# Patient Record
Sex: Female | Born: 1980 | Race: White | Hispanic: No | Smoking: Current every day smoker
Health system: Southern US, Community
[De-identification: ages and names within clinical notes are randomized; demographics above are authoritative.]

## PROBLEM LIST (undated history)

## (undated) DIAGNOSIS — K529 Noninfective gastroenteritis and colitis, unspecified: Secondary | ICD-10-CM

## (undated) DIAGNOSIS — K859 Acute pancreatitis without necrosis or infection, unspecified: Secondary | ICD-10-CM

## (undated) DIAGNOSIS — B1920 Unspecified viral hepatitis C without hepatic coma: Secondary | ICD-10-CM

## (undated) HISTORY — PX: OTHER SURGICAL HISTORY: SHX169

## (undated) HISTORY — PX: CHOLECYSTECTOMY: SHX55

## (undated) HISTORY — PX: LEG SURGERY: SHX1003

## (undated) HISTORY — PX: APPENDECTOMY: SHX54

---

## 2005-04-12 ENCOUNTER — Emergency Department (HOSPITAL_COMMUNITY): Admission: EM | Admit: 2005-04-12 | Discharge: 2005-04-12 | Payer: Self-pay | Admitting: Emergency Medicine

## 2005-07-05 ENCOUNTER — Emergency Department (HOSPITAL_COMMUNITY): Admission: EM | Admit: 2005-07-05 | Discharge: 2005-07-05 | Payer: Self-pay | Admitting: Emergency Medicine

## 2005-07-06 ENCOUNTER — Emergency Department (HOSPITAL_COMMUNITY): Admission: EM | Admit: 2005-07-06 | Discharge: 2005-07-06 | Payer: Self-pay | Admitting: Family Medicine

## 2005-09-04 ENCOUNTER — Ambulatory Visit: Payer: Self-pay | Admitting: Gastroenterology

## 2005-09-15 ENCOUNTER — Ambulatory Visit: Payer: Self-pay | Admitting: Gastroenterology

## 2005-10-06 ENCOUNTER — Encounter (INDEPENDENT_AMBULATORY_CARE_PROVIDER_SITE_OTHER): Payer: Self-pay | Admitting: Specialist

## 2005-10-06 ENCOUNTER — Ambulatory Visit (HOSPITAL_COMMUNITY): Admission: RE | Admit: 2005-10-06 | Discharge: 2005-10-06 | Payer: Self-pay | Admitting: Gastroenterology

## 2005-10-12 ENCOUNTER — Inpatient Hospital Stay (HOSPITAL_COMMUNITY): Admission: AD | Admit: 2005-10-12 | Discharge: 2005-10-12 | Payer: Self-pay | Admitting: Gynecology

## 2006-03-25 ENCOUNTER — Encounter: Admission: RE | Admit: 2006-03-25 | Discharge: 2006-03-25 | Payer: Self-pay | Admitting: Occupational Medicine

## 2006-05-11 ENCOUNTER — Inpatient Hospital Stay (HOSPITAL_COMMUNITY): Admission: AD | Admit: 2006-05-11 | Discharge: 2006-05-11 | Payer: Self-pay | Admitting: Obstetrics and Gynecology

## 2006-06-04 ENCOUNTER — Inpatient Hospital Stay (HOSPITAL_COMMUNITY): Admission: AD | Admit: 2006-06-04 | Discharge: 2006-06-06 | Payer: Self-pay | Admitting: Obstetrics and Gynecology

## 2006-09-14 ENCOUNTER — Encounter: Admission: RE | Admit: 2006-09-14 | Discharge: 2006-09-14 | Payer: Self-pay | Admitting: Neurology

## 2006-09-28 ENCOUNTER — Encounter: Admission: RE | Admit: 2006-09-28 | Discharge: 2006-09-28 | Payer: Self-pay | Admitting: Neurology

## 2006-10-04 ENCOUNTER — Ambulatory Visit (HOSPITAL_COMMUNITY): Admission: RE | Admit: 2006-10-04 | Discharge: 2006-10-04 | Payer: Self-pay | Admitting: Neurology

## 2006-10-25 ENCOUNTER — Ambulatory Visit: Payer: Self-pay | Admitting: Family Medicine

## 2006-11-30 ENCOUNTER — Ambulatory Visit: Payer: Self-pay | Admitting: Gastroenterology

## 2006-12-11 ENCOUNTER — Emergency Department (HOSPITAL_COMMUNITY): Admission: EM | Admit: 2006-12-11 | Discharge: 2006-12-11 | Payer: Self-pay | Admitting: Emergency Medicine

## 2007-01-04 ENCOUNTER — Ambulatory Visit: Payer: Self-pay | Admitting: Gastroenterology

## 2007-01-27 ENCOUNTER — Emergency Department (HOSPITAL_COMMUNITY): Admission: EM | Admit: 2007-01-27 | Discharge: 2007-01-27 | Payer: Self-pay | Admitting: Emergency Medicine

## 2007-02-08 ENCOUNTER — Ambulatory Visit: Payer: Self-pay | Admitting: Gastroenterology

## 2007-03-18 ENCOUNTER — Ambulatory Visit: Payer: Self-pay | Admitting: Physical Medicine & Rehabilitation

## 2007-03-18 ENCOUNTER — Encounter
Admission: RE | Admit: 2007-03-18 | Discharge: 2007-06-16 | Payer: Self-pay | Admitting: Physical Medicine & Rehabilitation

## 2007-03-24 ENCOUNTER — Encounter
Admission: RE | Admit: 2007-03-24 | Discharge: 2007-03-24 | Payer: Self-pay | Admitting: Physical Medicine & Rehabilitation

## 2007-04-13 ENCOUNTER — Emergency Department (HOSPITAL_COMMUNITY): Admission: EM | Admit: 2007-04-13 | Discharge: 2007-04-13 | Payer: Self-pay | Admitting: Emergency Medicine

## 2007-04-15 LAB — CONVERTED CEMR LAB: Pap Smear: NORMAL

## 2007-04-21 ENCOUNTER — Ambulatory Visit: Payer: Self-pay | Admitting: Gastroenterology

## 2007-04-26 ENCOUNTER — Emergency Department (HOSPITAL_COMMUNITY): Admission: EM | Admit: 2007-04-26 | Discharge: 2007-04-26 | Payer: Self-pay | Admitting: Emergency Medicine

## 2007-04-28 ENCOUNTER — Ambulatory Visit: Payer: Self-pay | Admitting: Physical Medicine & Rehabilitation

## 2007-05-16 ENCOUNTER — Inpatient Hospital Stay (HOSPITAL_COMMUNITY): Admission: AD | Admit: 2007-05-16 | Discharge: 2007-05-26 | Payer: Self-pay | Admitting: Gastroenterology

## 2007-05-24 ENCOUNTER — Encounter (INDEPENDENT_AMBULATORY_CARE_PROVIDER_SITE_OTHER): Payer: Self-pay | Admitting: General Surgery

## 2007-05-27 ENCOUNTER — Emergency Department (HOSPITAL_COMMUNITY): Admission: EM | Admit: 2007-05-27 | Discharge: 2007-05-27 | Payer: Self-pay | Admitting: Emergency Medicine

## 2007-06-07 ENCOUNTER — Observation Stay (HOSPITAL_COMMUNITY): Admission: EM | Admit: 2007-06-07 | Discharge: 2007-06-09 | Payer: Self-pay | Admitting: Gastroenterology

## 2007-06-13 ENCOUNTER — Ambulatory Visit: Payer: Self-pay | Admitting: Physical Medicine & Rehabilitation

## 2007-06-13 ENCOUNTER — Encounter
Admission: RE | Admit: 2007-06-13 | Discharge: 2007-09-11 | Payer: Self-pay | Admitting: Physical Medicine & Rehabilitation

## 2007-07-05 ENCOUNTER — Ambulatory Visit: Payer: Self-pay | Admitting: Physical Medicine & Rehabilitation

## 2007-08-04 ENCOUNTER — Encounter
Admission: RE | Admit: 2007-08-04 | Discharge: 2007-11-02 | Payer: Self-pay | Admitting: Physical Medicine & Rehabilitation

## 2007-08-29 ENCOUNTER — Ambulatory Visit (HOSPITAL_COMMUNITY): Admission: RE | Admit: 2007-08-29 | Discharge: 2007-08-29 | Payer: Self-pay | Admitting: Gastroenterology

## 2007-09-09 ENCOUNTER — Encounter: Admission: RE | Admit: 2007-09-09 | Discharge: 2007-10-17 | Payer: Self-pay | Admitting: Psychology

## 2007-09-30 ENCOUNTER — Ambulatory Visit: Payer: Self-pay | Admitting: Physical Medicine & Rehabilitation

## 2007-10-19 ENCOUNTER — Emergency Department (HOSPITAL_COMMUNITY): Admission: EM | Admit: 2007-10-19 | Discharge: 2007-10-19 | Payer: Self-pay | Admitting: Emergency Medicine

## 2007-11-16 ENCOUNTER — Inpatient Hospital Stay (HOSPITAL_COMMUNITY): Admission: RE | Admit: 2007-11-16 | Discharge: 2007-11-21 | Payer: Self-pay | Admitting: Psychiatry

## 2007-11-16 ENCOUNTER — Ambulatory Visit: Payer: Self-pay | Admitting: Psychiatry

## 2007-11-22 ENCOUNTER — Other Ambulatory Visit (HOSPITAL_COMMUNITY): Admission: RE | Admit: 2007-11-22 | Discharge: 2007-11-28 | Payer: Self-pay | Admitting: Psychiatry

## 2007-11-22 ENCOUNTER — Emergency Department (HOSPITAL_COMMUNITY): Admission: EM | Admit: 2007-11-22 | Discharge: 2007-11-22 | Payer: Self-pay | Admitting: Emergency Medicine

## 2007-11-23 ENCOUNTER — Ambulatory Visit: Payer: Self-pay | Admitting: Psychiatry

## 2007-11-28 ENCOUNTER — Encounter
Admission: RE | Admit: 2007-11-28 | Discharge: 2007-11-30 | Payer: Self-pay | Admitting: Physical Medicine & Rehabilitation

## 2007-11-29 ENCOUNTER — Ambulatory Visit: Payer: Self-pay | Admitting: Physical Medicine & Rehabilitation

## 2008-02-20 ENCOUNTER — Encounter
Admission: RE | Admit: 2008-02-20 | Discharge: 2008-05-20 | Payer: Self-pay | Admitting: Physical Medicine & Rehabilitation

## 2008-03-05 ENCOUNTER — Ambulatory Visit: Payer: Self-pay | Admitting: Physical Medicine & Rehabilitation

## 2008-03-20 ENCOUNTER — Emergency Department (HOSPITAL_BASED_OUTPATIENT_CLINIC_OR_DEPARTMENT_OTHER): Admission: EM | Admit: 2008-03-20 | Discharge: 2008-03-20 | Payer: Self-pay | Admitting: Emergency Medicine

## 2008-03-21 ENCOUNTER — Ambulatory Visit: Payer: Self-pay | Admitting: *Deleted

## 2008-03-21 DIAGNOSIS — G894 Chronic pain syndrome: Secondary | ICD-10-CM | POA: Insufficient documentation

## 2008-03-21 DIAGNOSIS — F319 Bipolar disorder, unspecified: Secondary | ICD-10-CM

## 2008-03-21 DIAGNOSIS — G43909 Migraine, unspecified, not intractable, without status migrainosus: Secondary | ICD-10-CM

## 2008-03-21 DIAGNOSIS — F411 Generalized anxiety disorder: Secondary | ICD-10-CM | POA: Insufficient documentation

## 2008-03-21 DIAGNOSIS — R112 Nausea with vomiting, unspecified: Secondary | ICD-10-CM

## 2008-03-21 DIAGNOSIS — K5289 Other specified noninfective gastroenteritis and colitis: Secondary | ICD-10-CM

## 2008-03-21 DIAGNOSIS — F450 Somatization disorder: Secondary | ICD-10-CM

## 2008-03-21 DIAGNOSIS — B1710 Acute hepatitis C without hepatic coma: Secondary | ICD-10-CM

## 2008-03-21 DIAGNOSIS — N912 Amenorrhea, unspecified: Secondary | ICD-10-CM

## 2008-03-21 DIAGNOSIS — F329 Major depressive disorder, single episode, unspecified: Secondary | ICD-10-CM

## 2008-03-21 DIAGNOSIS — N3000 Acute cystitis without hematuria: Secondary | ICD-10-CM | POA: Insufficient documentation

## 2008-03-21 DIAGNOSIS — R109 Unspecified abdominal pain: Secondary | ICD-10-CM | POA: Insufficient documentation

## 2008-03-22 DIAGNOSIS — J45909 Unspecified asthma, uncomplicated: Secondary | ICD-10-CM | POA: Insufficient documentation

## 2008-03-22 LAB — CONVERTED CEMR LAB: hCG, Beta Chain, Quant, S: 0.84 milliintl units/mL

## 2008-03-23 ENCOUNTER — Ambulatory Visit: Payer: Self-pay | Admitting: Internal Medicine

## 2008-03-23 ENCOUNTER — Inpatient Hospital Stay (HOSPITAL_COMMUNITY): Admission: EM | Admit: 2008-03-23 | Discharge: 2008-04-03 | Payer: Self-pay | Admitting: Emergency Medicine

## 2008-03-30 ENCOUNTER — Encounter (INDEPENDENT_AMBULATORY_CARE_PROVIDER_SITE_OTHER): Payer: Self-pay | Admitting: *Deleted

## 2008-04-06 ENCOUNTER — Telehealth (INDEPENDENT_AMBULATORY_CARE_PROVIDER_SITE_OTHER): Payer: Self-pay | Admitting: *Deleted

## 2008-05-07 ENCOUNTER — Emergency Department (HOSPITAL_BASED_OUTPATIENT_CLINIC_OR_DEPARTMENT_OTHER): Admission: EM | Admit: 2008-05-07 | Discharge: 2008-05-07 | Payer: Self-pay | Admitting: Emergency Medicine

## 2008-05-21 ENCOUNTER — Ambulatory Visit: Payer: Self-pay | Admitting: Radiology

## 2008-05-21 ENCOUNTER — Emergency Department (HOSPITAL_BASED_OUTPATIENT_CLINIC_OR_DEPARTMENT_OTHER): Admission: EM | Admit: 2008-05-21 | Discharge: 2008-05-21 | Payer: Self-pay | Admitting: Emergency Medicine

## 2008-05-26 ENCOUNTER — Emergency Department (HOSPITAL_BASED_OUTPATIENT_CLINIC_OR_DEPARTMENT_OTHER): Admission: EM | Admit: 2008-05-26 | Discharge: 2008-05-26 | Payer: Self-pay | Admitting: Emergency Medicine

## 2008-06-13 ENCOUNTER — Emergency Department (HOSPITAL_COMMUNITY): Admission: EM | Admit: 2008-06-13 | Discharge: 2008-06-13 | Payer: Self-pay | Admitting: Emergency Medicine

## 2008-06-18 ENCOUNTER — Emergency Department (HOSPITAL_COMMUNITY): Admission: EM | Admit: 2008-06-18 | Discharge: 2008-06-18 | Payer: Self-pay | Admitting: Emergency Medicine

## 2008-06-29 ENCOUNTER — Emergency Department (HOSPITAL_BASED_OUTPATIENT_CLINIC_OR_DEPARTMENT_OTHER): Admission: EM | Admit: 2008-06-29 | Discharge: 2008-06-29 | Payer: Self-pay | Admitting: Emergency Medicine

## 2008-06-29 ENCOUNTER — Ambulatory Visit: Payer: Self-pay | Admitting: Diagnostic Radiology

## 2008-07-08 ENCOUNTER — Emergency Department (HOSPITAL_COMMUNITY): Admission: EM | Admit: 2008-07-08 | Discharge: 2008-07-08 | Payer: Self-pay | Admitting: Emergency Medicine

## 2008-07-23 ENCOUNTER — Emergency Department (HOSPITAL_BASED_OUTPATIENT_CLINIC_OR_DEPARTMENT_OTHER): Admission: EM | Admit: 2008-07-23 | Discharge: 2008-07-24 | Payer: Self-pay | Admitting: Emergency Medicine

## 2008-07-23 ENCOUNTER — Ambulatory Visit: Payer: Self-pay | Admitting: Diagnostic Radiology

## 2008-07-30 ENCOUNTER — Emergency Department (HOSPITAL_BASED_OUTPATIENT_CLINIC_OR_DEPARTMENT_OTHER): Admission: EM | Admit: 2008-07-30 | Discharge: 2008-07-30 | Payer: Self-pay | Admitting: Emergency Medicine

## 2009-07-18 ENCOUNTER — Ambulatory Visit: Payer: Self-pay | Admitting: Family Medicine

## 2009-07-18 DIAGNOSIS — M79609 Pain in unspecified limb: Secondary | ICD-10-CM

## 2009-07-18 DIAGNOSIS — K859 Acute pancreatitis without necrosis or infection, unspecified: Secondary | ICD-10-CM

## 2009-07-18 DIAGNOSIS — K5090 Crohn's disease, unspecified, without complications: Secondary | ICD-10-CM | POA: Insufficient documentation

## 2009-08-12 ENCOUNTER — Encounter: Payer: Self-pay | Admitting: Family Medicine

## 2009-08-12 ENCOUNTER — Telehealth (INDEPENDENT_AMBULATORY_CARE_PROVIDER_SITE_OTHER): Payer: Self-pay | Admitting: *Deleted

## 2009-08-16 ENCOUNTER — Telehealth: Payer: Self-pay | Admitting: Family Medicine

## 2009-08-26 ENCOUNTER — Ambulatory Visit: Payer: Self-pay | Admitting: Family Medicine

## 2009-08-26 DIAGNOSIS — J029 Acute pharyngitis, unspecified: Secondary | ICD-10-CM

## 2009-08-27 ENCOUNTER — Encounter: Payer: Self-pay | Admitting: Family Medicine

## 2009-08-27 LAB — CONVERTED CEMR LAB
Eosinophils Absolute: 0 10*3/uL (ref 0.0–0.7)
Eosinophils Relative: 0 % (ref 0–5)
HCT: 46 % (ref 36.0–46.0)
Lymphs Abs: 2.2 10*3/uL (ref 0.7–4.0)
MCV: 93.5 fL (ref 78.0–100.0)
Monocytes Absolute: 0.4 10*3/uL (ref 0.1–1.0)
Monocytes Relative: 4 % (ref 3–12)
RBC: 4.92 M/uL (ref 3.87–5.11)
WBC: 8.2 10*3/uL (ref 4.0–10.5)

## 2009-09-04 ENCOUNTER — Telehealth: Payer: Self-pay | Admitting: Family Medicine

## 2009-09-05 IMAGING — RF DG UGI W/ SMALL BOWEL
17 of 24 series · 17 of 24 positions shown · non-contrast
Comparison: None

Addendum Begins

A IUD in place was noted midline pelvis.
Addendum Ends
CLINICAL DATA: Rule out Crohn's disease or peptic ulcer
UPPER GI W/ SMALL BOWEL HIGH DENSITY
TECHNIQUE: Upper GI series performed with high density barium and
effervescent agent. Thin barium also used.  Subsequently, serial
images of the small bowel were obtained including spot views of the
terminal ileum.

[Series 1: run · 1 of 1 slices shown (1 of 16)]
[im 1/1]
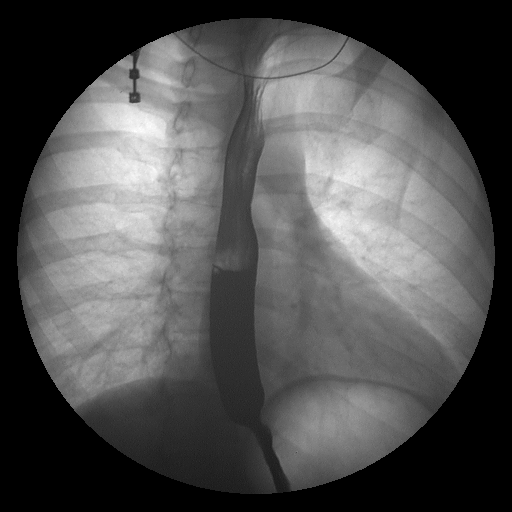

[Series 3: run · 1 of 1 slices shown (2 of 16)]
[im 1/1]
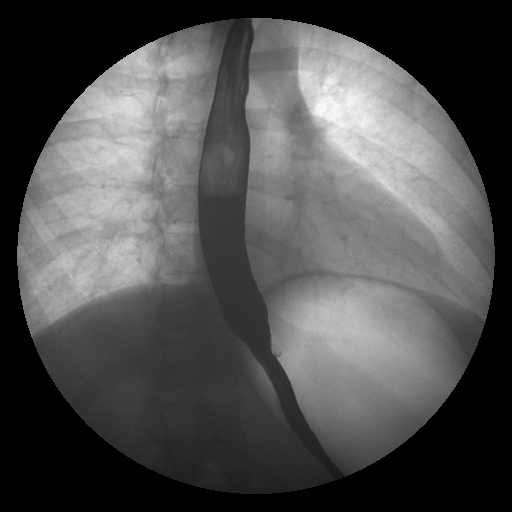

[Series 4: run · 1 of 1 slices shown (3 of 16)]
[im 1/1]
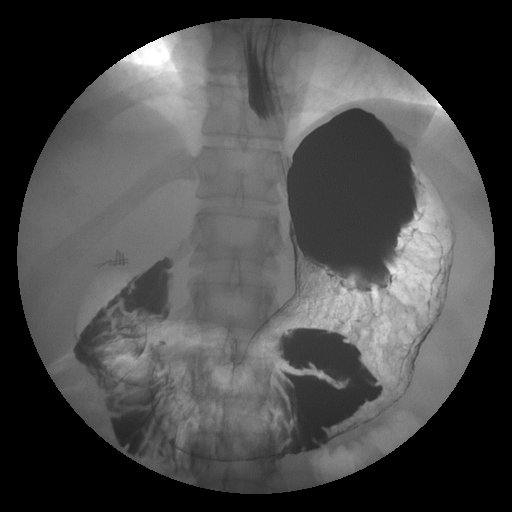

[Series 5: run · 1 of 1 slices shown (4 of 16)]
[im 1/1]
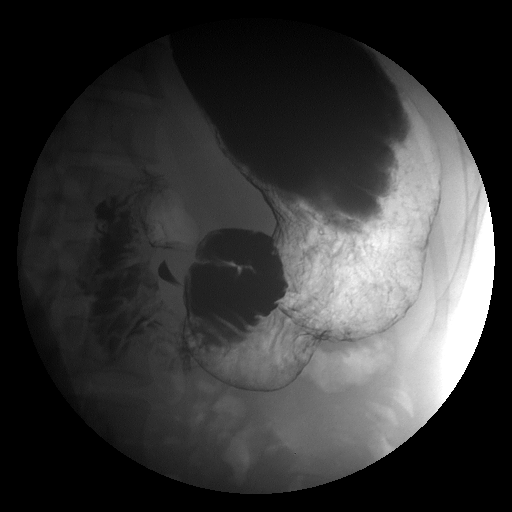

[Series 7: run · 1 of 1 slices shown (5 of 16)]
[im 1/1]
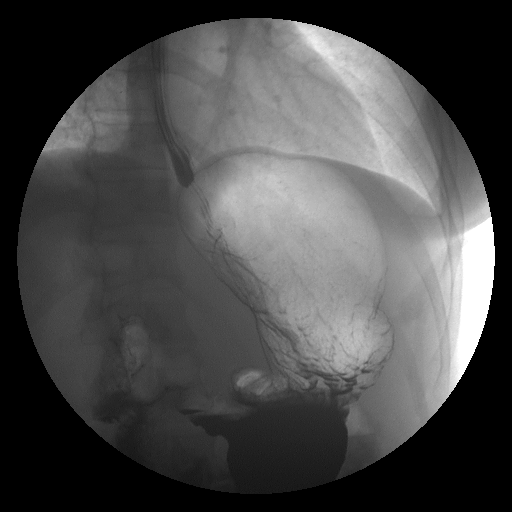

[Series 8: run · 1 of 1 slices shown (6 of 16)]
[im 1/1]
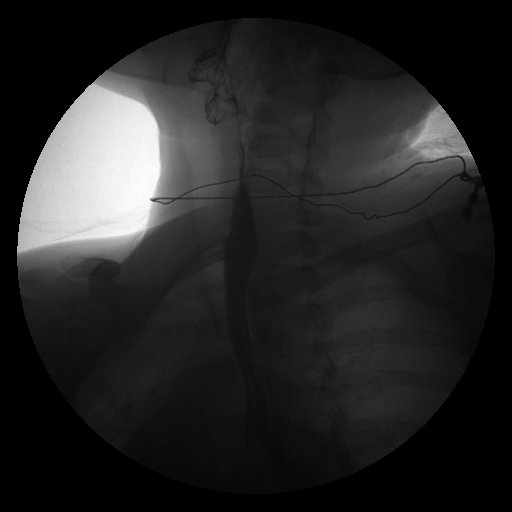

[Series 10: run · 1 of 1 slices shown (7 of 16)]
[im 1/1]
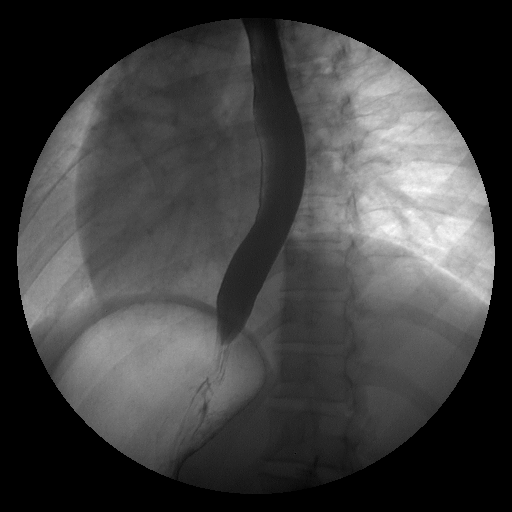

[Series 11: run · 1 of 1 slices shown (8 of 16)]
[im 1/1]
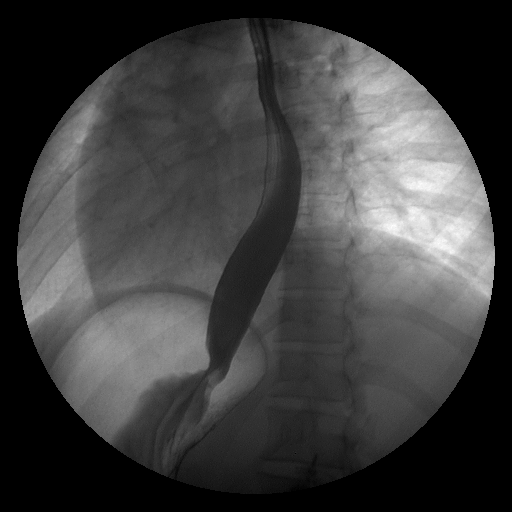

[Series 13: run · 1 of 1 slices shown (9 of 16)]
[im 1/1]
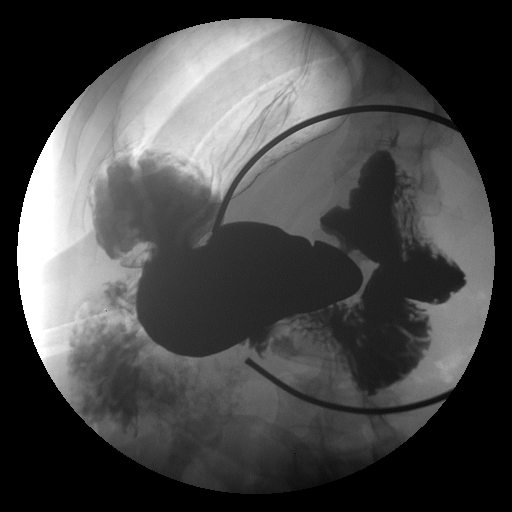

[Series 14: run · 1 of 1 slices shown (10 of 16)]
[im 1/1]
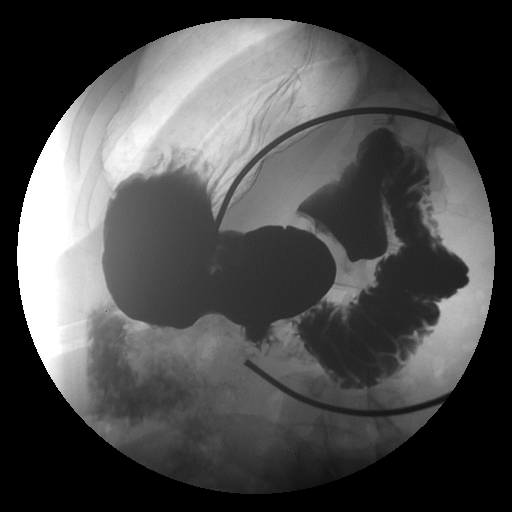

[Series 15: run · 1 of 1 slices shown (11 of 16)]
[im 1/1]
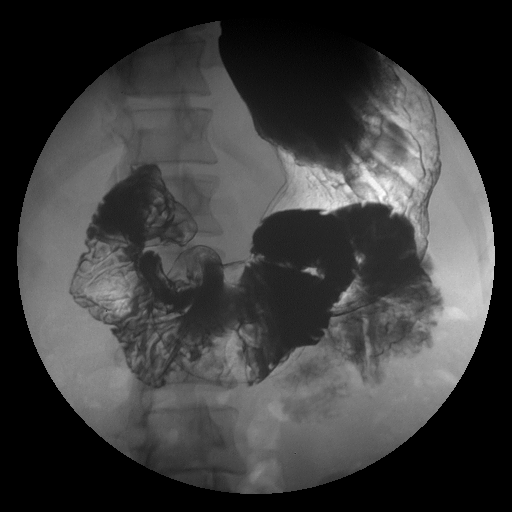

[Series 17: run · 1 of 1 slices shown (12 of 16)]
[im 1/1]
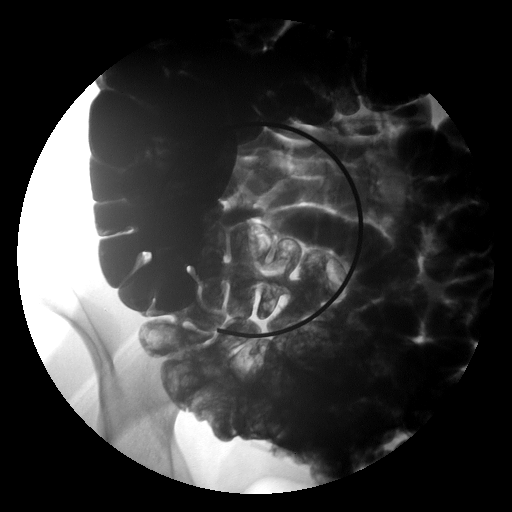

[Series 18: run · 1 of 1 slices shown (13 of 16)]
[im 1/1]
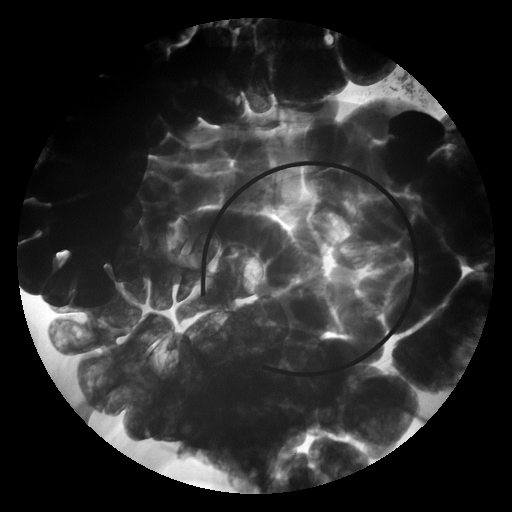

[Series 20: run · 1 of 1 slices shown (14 of 16)]
[im 1/1]
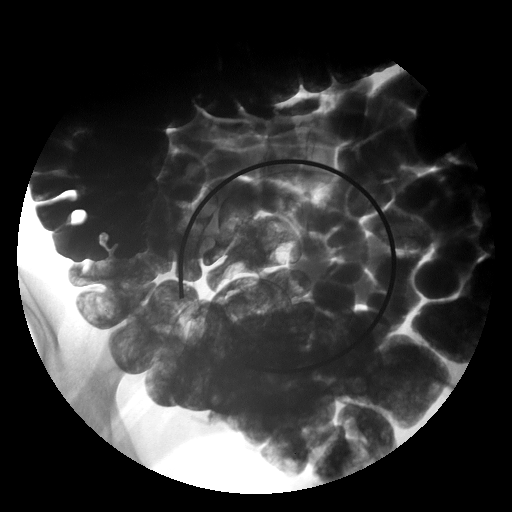

[Series 21: run · 1 of 1 slices shown (15 of 16)]
[im 1/1]
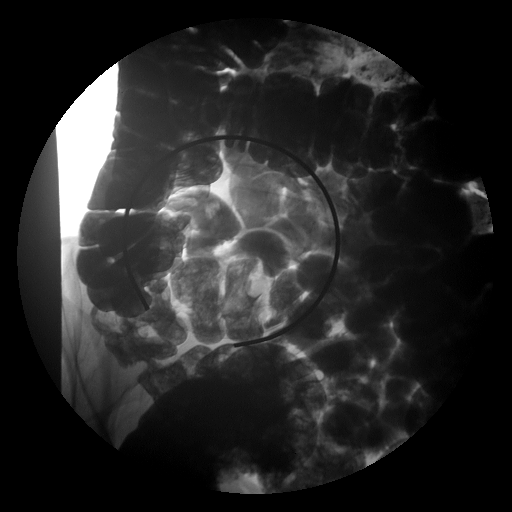

[Series 22: run · 1 of 1 slices shown (16 of 16)]
[im 1/1]
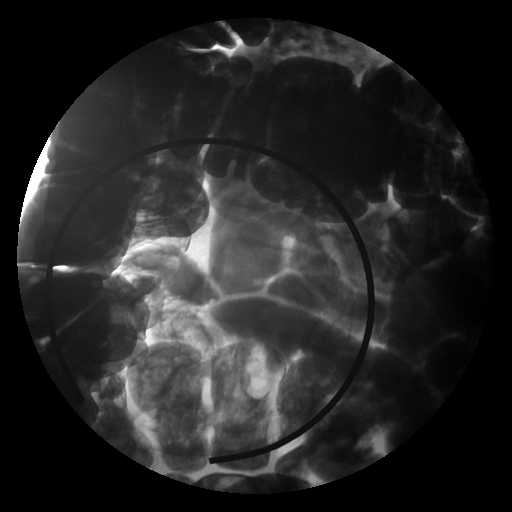

[Series 1002: view not recorded · 0.20mm/px · 1 of 1 slices shown]
[im 1/1]
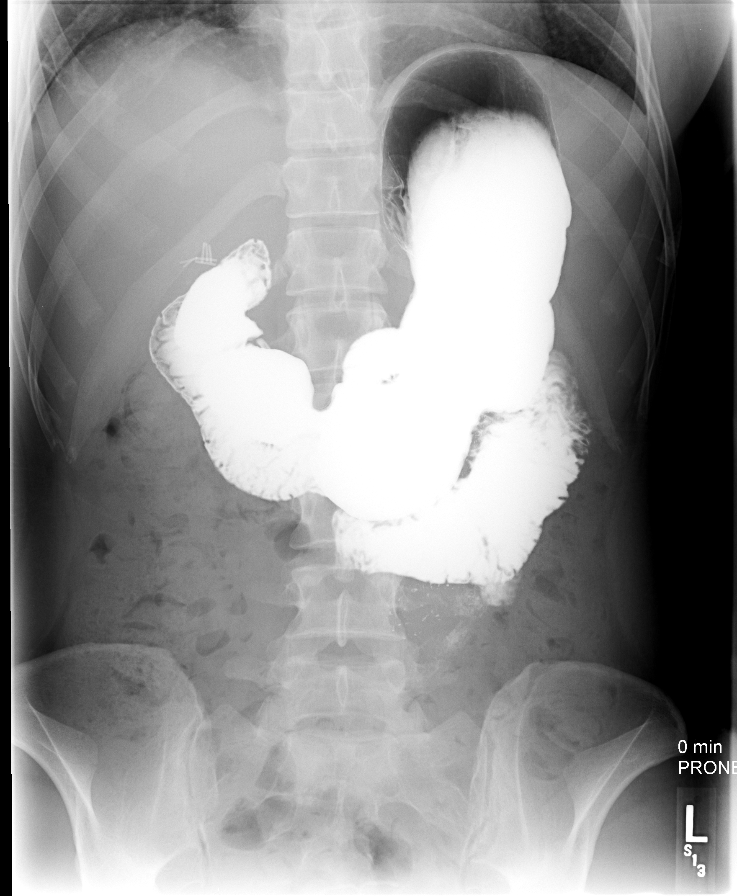

[17 of 24 positions shown; findings below may reference images not displayed]

FINDINGS: Double contrast upper GI shows the esophagus with normal
contour, distensibility and peristalsis.  No obstructing or
constricting mass is noted.  No gastroesophageal reflux was noted.
Post cholecystectomy surgical clips are noted.

The stomach shows normal contour, distensibility and peristalsis.
No obstructing or constricting mass.  Duodenal bulb and duodenal
sweep are unremarkable.  No ulcer is noted.  Spot view and
overheads were taken to evaluate the small bowel.  The transit time
is within normal limits.  The terminal ileum is unremarkable.
Stool noted throughout the colon.  No obstructing or constricting
mass is noted.  Fluoroscopy time was 5 minutes.
IMPRESSION: Unremarkable small bowel follow-through.  Unremarkable upper GI.
No ulcer.  No small bowel obstruction.  No gastroesophageal reflux.

## 2009-09-12 ENCOUNTER — Encounter: Payer: Self-pay | Admitting: Family Medicine

## 2009-09-13 ENCOUNTER — Telehealth (INDEPENDENT_AMBULATORY_CARE_PROVIDER_SITE_OTHER): Payer: Self-pay | Admitting: *Deleted

## 2009-09-13 ENCOUNTER — Telehealth: Payer: Self-pay | Admitting: Family Medicine

## 2009-09-25 ENCOUNTER — Ambulatory Visit: Payer: Self-pay | Admitting: Family Medicine

## 2009-09-30 ENCOUNTER — Telehealth (INDEPENDENT_AMBULATORY_CARE_PROVIDER_SITE_OTHER): Payer: Self-pay | Admitting: *Deleted

## 2009-10-02 ENCOUNTER — Ambulatory Visit: Payer: Self-pay | Admitting: Family Medicine

## 2009-10-02 DIAGNOSIS — R109 Unspecified abdominal pain: Secondary | ICD-10-CM

## 2009-10-02 LAB — CONVERTED CEMR LAB
Bilirubin Urine: NEGATIVE
Casts: NONE SEEN /lpf
Crystals: NONE SEEN
Ketones, ur: NEGATIVE mg/dL
Nitrite: NEGATIVE
Specific Gravity, Urine: 1.03 (ref 1.005–1.030)
Urine Glucose: NEGATIVE mg/dL
pH: 6 (ref 5.0–8.0)

## 2009-10-03 ENCOUNTER — Encounter: Admission: RE | Admit: 2009-10-03 | Discharge: 2009-10-03 | Payer: Self-pay | Admitting: Family Medicine

## 2009-10-03 ENCOUNTER — Telehealth (INDEPENDENT_AMBULATORY_CARE_PROVIDER_SITE_OTHER): Payer: Self-pay | Admitting: *Deleted

## 2009-10-04 LAB — CONVERTED CEMR LAB
Benzodiazepines.: POSITIVE — AB
Marijuana Metabolite: NEGATIVE
Methadone: NEGATIVE
Propoxyphene: NEGATIVE

## 2009-10-08 ENCOUNTER — Telehealth: Payer: Self-pay | Admitting: Family Medicine

## 2009-10-09 ENCOUNTER — Telehealth: Payer: Self-pay | Admitting: Family Medicine

## 2009-10-11 ENCOUNTER — Telehealth: Payer: Self-pay | Admitting: Family Medicine

## 2009-11-22 ENCOUNTER — Telehealth (INDEPENDENT_AMBULATORY_CARE_PROVIDER_SITE_OTHER): Payer: Self-pay | Admitting: *Deleted

## 2010-04-15 ENCOUNTER — Emergency Department (HOSPITAL_COMMUNITY): Admission: EM | Admit: 2010-04-15 | Discharge: 2010-04-15 | Payer: Self-pay | Admitting: Emergency Medicine

## 2010-05-09 ENCOUNTER — Emergency Department (HOSPITAL_COMMUNITY)
Admission: EM | Admit: 2010-05-09 | Discharge: 2010-05-09 | Payer: Self-pay | Source: Home / Self Care | Admitting: Emergency Medicine

## 2010-05-15 ENCOUNTER — Emergency Department (HOSPITAL_COMMUNITY)
Admission: EM | Admit: 2010-05-15 | Discharge: 2010-05-16 | Payer: Self-pay | Source: Home / Self Care | Admitting: Emergency Medicine

## 2010-05-22 ENCOUNTER — Emergency Department (HOSPITAL_COMMUNITY)
Admission: EM | Admit: 2010-05-22 | Discharge: 2010-05-22 | Payer: Self-pay | Source: Home / Self Care | Admitting: Emergency Medicine

## 2010-05-22 LAB — DIFFERENTIAL
Basophils Absolute: 0 10*3/uL (ref 0.0–0.1)
Basophils Relative: 0 % (ref 0–1)
Eosinophils Absolute: 0.2 10*3/uL (ref 0.0–0.7)
Eosinophils Relative: 2 % (ref 0–5)
Lymphocytes Relative: 27 % (ref 12–46)
Lymphs Abs: 2.3 10*3/uL (ref 0.7–4.0)
Monocytes Absolute: 0.6 10*3/uL (ref 0.1–1.0)
Monocytes Relative: 8 % (ref 3–12)
Neutro Abs: 5.4 10*3/uL (ref 1.7–7.7)
Neutrophils Relative %: 63 % (ref 43–77)

## 2010-05-22 LAB — COMPREHENSIVE METABOLIC PANEL
ALT: 40 U/L — ABNORMAL HIGH (ref 0–35)
AST: 31 U/L (ref 0–37)
Albumin: 3.9 g/dL (ref 3.5–5.2)
Alkaline Phosphatase: 79 U/L (ref 39–117)
BUN: 8 mg/dL (ref 6–23)
CO2: 24 mEq/L (ref 19–32)
Calcium: 9 mg/dL (ref 8.4–10.5)
Chloride: 111 mEq/L (ref 96–112)
Creatinine, Ser: 0.66 mg/dL (ref 0.4–1.2)
GFR calc Af Amer: >60 mL/min (ref >60)
GFR calc non Af Amer: >60 mL/min (ref >60)
Glucose, Bld: 93 mg/dL (ref 70–99)
Potassium: 3.8 mEq/L (ref 3.5–5.1)
Sodium: 142 mEq/L (ref 135–145)
Total Bilirubin: 0.4 mg/dL (ref 0.3–1.2)
Total Protein: 7 g/dL (ref 6.0–8.3)

## 2010-05-22 LAB — URINALYSIS, ROUTINE W REFLEX MICROSCOPIC
Bilirubin Urine: NEGATIVE
Hemoglobin, Urine: NEGATIVE
Ketones, ur: NEGATIVE mg/dL
Nitrite: NEGATIVE
Protein, ur: NEGATIVE mg/dL
Specific Gravity, Urine: 1.026 (ref 1.005–1.030)
Urine Glucose, Fasting: NEGATIVE mg/dL
Urobilinogen, UA: 0.2 mg/dL (ref 0.0–1.0)
pH: 6 (ref 5.0–8.0)

## 2010-05-22 LAB — CBC
HCT: 42.3 % (ref 36.0–46.0)
Hemoglobin: 14.6 g/dL (ref 12.0–15.0)
MCH: 31.4 pg (ref 26.0–34.0)
MCHC: 34.5 g/dL (ref 30.0–36.0)
MCV: 91 fL (ref 78.0–100.0)
Platelets: 133 10*3/uL — ABNORMAL LOW (ref 150–400)
RBC: 4.65 MIL/uL (ref 3.87–5.11)
RDW: 12.9 % (ref 11.5–15.5)
WBC: 8.5 10*3/uL (ref 4.0–10.5)

## 2010-05-22 LAB — PREGNANCY, URINE: Preg Test, Ur: NEGATIVE

## 2010-05-22 LAB — LIPASE, BLOOD: Lipase: 28 U/L (ref 11–59)

## 2010-06-08 ENCOUNTER — Encounter: Payer: Self-pay | Admitting: Gastroenterology

## 2010-06-08 ENCOUNTER — Encounter: Payer: Self-pay | Admitting: Neurology

## 2010-06-17 NOTE — Progress Notes (Signed)
Summary: Dismissal Letter Mailed Certified  Dismissal Letter sent by certified mail. Vara Guardian  September 13, 2009 11:45 AM

## 2010-06-17 NOTE — Progress Notes (Signed)
Summary: Med refills  Phone Note Refill Request Message from:  Patient on Oct 11, 2009 10:06 AM  Refills Requested: Medication #1:  ALPRAZOLAM 1 MG TABS 1 tab by mouth three times a day as needed anxiety  Medication #2:  OXYCODONE HCL 10 MG TABS 1 tab by mouth q 6 hrs as needed severe pain Initial call taken by: Payton Spark CMA,  Oct 11, 2009 10:07 AM    Prescriptions: OXYCODONE HCL 10 MG TABS (OXYCODONE HCL) 1 tab by mouth q 6 hrs as needed severe pain  #120 x 0   Entered and Authorized by:   Seymour Bars DO   Signed by:   Seymour Bars DO on 10/11/2009   Method used:   Print then Give to Patient   RxID:   [id number] ALPRAZOLAM 1 MG TABS (ALPRAZOLAM) 1 tab by mouth three times a day as needed anxiety  #90 x 0   Entered and Authorized by:   Seymour Bars DO   Signed by:   Seymour Bars DO on 10/11/2009   Method used:   Print then Give to Patient   RxID:   [id number]   Appended Document: Med refills Pt aware. Also reminded Pt that this will be her last refill and she must find a new PCP. Pt verbalized understanding.

## 2010-06-17 NOTE — Progress Notes (Signed)
Summary: Refills  Phone Note Refill Request Message from:  Patient on August 16, 2009 8:07 AM  Refills Requested: Medication #1:  ALPRAZOLAM 1 MG TABS 1 tab by mouth three times a day as needed anxiety  Medication #2:  OXYCODONE HCL 10 MG TABS 1 tab by mouth q 6 hrs as needed severe pain Initial call taken by: Payton Spark CMA,  August 16, 2009 8:07 AM    Prescriptions: OXYCODONE HCL 10 MG TABS (OXYCODONE HCL) 1 tab by mouth q 6 hrs as needed severe pain  #120 x 0   Entered and Authorized by:   Seymour Bars DO   Signed by:   Seymour Bars DO on 08/16/2009   Method used:   Printed then faxed to ...       [facility] [address] [phone]* (retail)       [address]       [place], [place]  [address]       Ph: [phone]       Fax: [phone]   RxID:   [id number] ALPRAZOLAM 1 MG TABS (ALPRAZOLAM) 1 tab by mouth three times a day as needed anxiety  #90 x 0   Entered and Authorized by:   Seymour Bars DO   Signed by:   Seymour Bars DO on 08/16/2009   Method used:   Printed then faxed to ...       [facility] [address] [phone]* (retail)       [address]       [place], [place]  [address]       Ph: [phone]       Fax: [phone]   RxID:   [id number]

## 2010-06-17 NOTE — Assessment & Plan Note (Signed)
Summary: [name]   Vital Signs:  Patient profile:   30 year old female Height:      63 inches Weight:      126 pounds BMI:     22.40 O2 Sat:      97 % on Room air Temp:     98.8 degrees F oral Pulse rate:   133 / minute BP sitting:   130 / 94  (left arm) Cuff size:   regular  Vitals Entered By: Payton Spark CMA (August 26, 2009 3:22 PM)  O2 Flow:  Room air CC: HA, ST, nausea, vomiting, chills, and hoarse x 5 days.    Primary Care Provider:  Seymour Bars DO  CC:  HA, ST, nausea, vomiting, chills, and and hoarse x 5 days. Marland Kitchen  History of Present Illness: 30 yo WF presents for 5 days of sore throat pain, sweating, N/V and subjective fevers.  She has acute laryngitis.  No rhinorrhea or cough.  No sick contacts.  She denies any abdominal pain.    She is taking Theraflu, Nyquil, Chlorasceptic.  She has had problems with tonsilitis over the years.  She is having a hard time getting fluids down.    Allergies: No Known Drug Allergies  Past History:  Past Medical History: Reviewed history from 07/18/2009 and no changes required. Chronic pain syndrome - was seeing - Dr. Riley Kill Somatization disorder depression anxiety bipolar hepatitis c migraines h/o alochol and drug abuse Asthma UTI's recurrent  Past Surgical History: Reviewed history from 07/18/2009 and no changes required. Appendectomy Cholecystectomy gall bladder 09 R leg surgery 2010  Social History: Reviewed history from 07/18/2009 and no changes required. Unemplolyed  one child - in the care of the child's father separated from husband dating Leonoard Hollifield. Quit smoking 1 ppd x 16 yrs.-- still smoking. Has Mirena IUD  Review of Systems      See HPI  Physical Exam  General:  alert, well-developed, well-nourished, and well-hydrated.   Head:  normocephalic and atraumatic.   Eyes:  conjunctiva clear Nose:  no rhinorrhea Mouth:  throat mildly injected.  patent airway.  o/p vesicles present.  No  exudates.  no tonsilar hypertrophy Neck:  no masses.   Lungs:  Normal respiratory effort, chest expands symmetrically. Lungs are clear to auscultation, no crackles or wheezes. Heart:  no murmur and tachycardia.   Abdomen:  soft and non-tender.   Skin:  color normal.   Cervical Nodes:  shotty anterior cervical chain LA   Impression & Recommendations:  Problem # 1:  ACUTE PHARYNGITIS (ICD-462) Rapid strep neg.  Appears to have [name].   Check CBC with diff given rise in BP and HR with N/V during acute illness. Treat with supportive care -- MMW, Ibuprofen, Zofran and plenty of clear liquids.   Orders: Rapid Strep (956[address]) T-CBC w/Diff ([id number])  Complete Medication List: 1)  Trazodone Hcl 100 Mg Tabs (Trazodone hcl) .... One at bedtime 2)  Alprazolam 1 Mg Tabs (Alprazolam) .Marland Kitchen.. 1 tab by mouth three times a day as needed anxiety 3)  Oxycodone Hcl 10 Mg Tabs (Oxycodone hcl) .Marland Kitchen.. 1 tab by mouth q 6 hrs as needed severe pain 4)  Neurontin 300 Mg Caps (Gabapentin) .... Take 1 cap by mouth three times a day 5)  Remeron 15 Mg Tabs (Mirtazapine) .... Take 1 tab by mouth at bedtime 6)  Dukes Magic Mouthwash  .Marland Kitchen.. 10 ml swish and gargle 4 x a day as needed for sore throat pain 7)  Zofran Odt  8 Mg Tbdp (Ondansetron) .Marland Kitchen.. 1 tab by mouth three times a day as needed nausea  Patient Instructions: 1)  CBC today. 2)  Will call you w/ results tomorrow. 3)  Supportive care for [name] with MMW, ibuprofen, clear fluids and rest.  This usually lasts about 7 days. 4)  Use Zofran as needed for nausea. Prescriptions: ZOFRAN ODT 8 MG TBDP (ONDANSETRON) 1 tab by mouth three times a day as needed nausea  #24 x 0   Entered and Authorized by:   Seymour Bars DO   Signed by:   Seymour Bars DO on 08/26/2009   Method used:   Electronically to        [facility] [phone]* (retail)       [address]       [place], [place]  [address]       Ph: [phone]       Fax: [phone]   RxID:    [id number] DUKES MAGIC MOUTHWASH 10 ml swish and gargle 4 x a day as needed for sore throat pain  #160 ml x 0   Entered and Authorized by:   Seymour Bars DO   Signed by:   Seymour Bars DO on 08/26/2009   Method used:   Printed then faxed to ...       [facility] [address] [phone]* (retail)       [address]       [place], [place]  [address]       Ph: [phone]       Fax: [phone]   RxID:   [id number]   Laboratory Results    Other Tests  Rapid Strep: negative

## 2010-06-17 NOTE — Letter (Signed)
Summary: Discharge Letter  [facility]  [address] [place], [place] [address]   Phone: [phone]  Fax: [phone]       09/12/2009 MRN: [id number]  [facility] [address] [place], [place]  [address]  Dear Ms. Oesterling,   I find it necessary to inform you that I will not be able to provide medical care to you, because you have been non-compliant with our controlled substance agreement.   Since your condition requires medical attention, I suggest that you place your self under the care of another physician without delay. If you desire, I will be available for emergency care for 30 days after you receive this letter.  This should give you ample time to select a physician of your choice from the many competent providers in this area. You may want to call the local medical society or [facility]'s physician referral service [phone]) for their assistance in locating a new physician. With your written authorization, I will make a copy of your medical record available to your new physician.   Sincerely,    Seymour Bars, DO

## 2010-06-17 NOTE — Progress Notes (Signed)
Summary: Requesting early refills.  Phone Note Call from Patient   Caller: Patient Summary of Call: Pt LMOM stating her pain and anxiety has been getting worse and she has had to take xanax and oxycodone more often than Rxd. Pt would like to know if you will refill her early. Please advise. Initial call taken by: Payton Spark CMA,  September 04, 2009 10:47 AM  Follow-up for Phone Call        This is the 2nd time that pt has requested early RFs of narcotics which is grounds for dismissal with a signed narcotic contract on 07-18-2009.  I will sign a letter of dismissal for this pt.   Follow-up by: Seymour Bars DO,  September 04, 2009 11:15 AM
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
## 2010-06-17 NOTE — Miscellaneous (Signed)
Summary: Controlled Substance Agreement/[place] [facility]  Controlled Substance Agreement/[place] [facility]   Imported By: Lanelle Bal 07/26/2009 11:50:02  _____________________________________________________________________  External Attachment:    Type:   Image     Comment:   External Document

## 2010-06-17 NOTE — Progress Notes (Signed)
Summary: Dismissal Letter Resent by 1st Class Mail  Letter undeliverable. Resent by first class mail. Vara Guardian  Oct 03, 2009 11:45 AM

## 2010-06-17 NOTE — Assessment & Plan Note (Signed)
Summary: chrons/ ovarian cyst/ meds   Vital Signs:  Patient profile:   30 year old female Height:      63 inches Weight:      128 pounds BMI:     22.76 O2 Sat:      100 % on Room air Pulse rate:   128 / minute BP sitting:   122 / 85  (left arm) Cuff size:   regular  Vitals Entered By: Payton Spark CMA (Oct 02, 2009 1:15 PM)  O2 Flow:  Room air CC: F/U. Increased pain and anxiety   Primary Care Provider:  Seymour Bars DO  CC:  F/U. Increased pain and anxiety.  History of Present Illness: 30 yo WF presents for a flare up of her chrons dz.  She is having diarrhea and abdominal pain.  She has had urinary symptoms and an ovarian cyst in the past.    She is on chronic pain meds and chronic benzos.  Since her last visit, she has requested RX pain meds early on 2 occasions and a discharge letter went out which she claims that she did not recieve.  She admits to not beeing able to 'afford' going to a pain clinic or psychiatrist.  She admits to taking  more than the recommended dose of her benzos and narcotics.    Current Medications (verified): 1)  Trazodone Hcl 100 Mg Tabs (Trazodone Hcl) .... One At Bedtime 2)  Alprazolam 1 Mg Tabs (Alprazolam) .Marland Kitchen.. 1 Tab By Mouth Three Times A Day As Needed Anxiety 3)  Oxycodone Hcl 10 Mg Tabs (Oxycodone Hcl) .Marland Kitchen.. 1 Tab By Mouth Q 6 Hrs As Needed Severe Pain 4)  Neurontin 300 Mg Caps (Gabapentin) .... Take 1 Cap By Mouth Three Times A Day 5)  Remeron 15 Mg Tabs (Mirtazapine) .... Take 1 Tab By Mouth At Bedtime 6)  Zofran Odt 8 Mg Tbdp (Ondansetron) .Marland Kitchen.. 1 Tab By Mouth Three Times A Day As Needed Nausea  Allergies (verified): No Known Drug Allergies  Past History:  Past Medical History: Reviewed history from 07/18/2009 and no changes required. Chronic pain syndrome - was seeing - Dr. Riley Kill Somatization disorder depression anxiety bipolar hepatitis c migraines h/o alochol and drug abuse Asthma UTI's recurrent  Past Surgical  History: Reviewed history from 07/18/2009 and no changes required. Appendectomy Cholecystectomy gall bladder 09 R leg surgery 2010  Social History: Reviewed history from 08/26/2009 and no changes required. Unemplolyed  one child - in the care of the child's father separated from husband dating Leonoard Hollifield. Quit smoking 1 ppd x 16 yrs.-- still smoking. Has Mirena IUD  Review of Systems      See HPI  Physical Exam  General:  alert, well-developed, well-nourished, and well-hydrated.   Head:  normocephalic and atraumatic.   Eyes:  sclera non icteric Mouth:  pharynx pink and moist.   Neck:  no masses.   Lungs:  Normal respiratory effort, chest expands symmetrically. Lungs are clear to auscultation, no crackles or wheezes. Heart:  no murmur and tachycardia.   Abdomen:  soft.  diffusely TTP with vol guarding.  ND.  NABS Skin:  color normal.   Cervical Nodes:  No lymphadenopathy noted Psych:  good eye contact and slightly anxious.     Impression & Recommendations:  Problem # 1:  PELVIC PAIN, RIGHT (ICD-789.09) Assessment New Given hx of ovarian cyst, current with IUD in, unlikely to be ectopic pregnancy.  UA with micro shows Trichomonas --> will treat with Flagyl 2 gram  x 1.  This may have caused some pelvic pain.  U/S done shows a small ? hemorraghic cyst on the R ovary o/w normal.  Will treat conservatively with NSAIDs and heating pad.  If not improved in 5 days, will get her in with gyn. Her updated medication list for this problem includes:    Oxycodone Hcl 10 Mg Tabs (Oxycodone hcl) .Marland Kitchen... 1 tab by mouth q 6 hrs as needed severe pain  Orders: T-*Unlisted Diagnostic X-ray test/procedure (04540) T-Urinalysis ([id number])  Problem # 2:  CHRONIC PAIN SYNDROME (ICD-338.4) Complicated hx and hx of abuse.  As stated prior, Lalanya has requested early RF x 2 and admits to taking more than the prescribed dose of Oxycodone under a narcotic contract with me.  She has been  formally discharged from the practice.  In order for me to bridge her meds, she will need to pass a UDS today. Orders: T-Drug Screen-Urine, (single) [id number]) T-Drug Screen-Urine, ea (mullti) [phone])  Problem # 3:  CROHN'S DISEASE (ICD-555.9) Having abd cramping and diarrhea.  Has seen GI but was due for f/u.  For now, I will treat her with short term use of Prednisone but she needs to f/u with GI.  Problem # 4:  ANXIETY (ICD-300.00) Pt is taking more than the recommended dose of alprazolam.  Will give her 30 days to find a new provider.  She has formally been discharged from this practice.  She has requested early RFs 2 x and admits to using more than the recommended dose. Her updated medication list for this problem includes:    Trazodone Hcl 100 Mg Tabs (Trazodone hcl) ..... One at bedtime    Alprazolam 1 Mg Tabs (Alprazolam) .Marland Kitchen... 1 tab by mouth three times a day as needed anxiety    Remeron 30 Mg Tabs (Mirtazapine) .Marland Kitchen... 1 tab by mouth daily  Complete Medication List: 1)  Trazodone Hcl 100 Mg Tabs (Trazodone hcl) .... One at bedtime 2)  Alprazolam 1 Mg Tabs (Alprazolam) .Marland Kitchen.. 1 tab by mouth three times a day as needed anxiety 3)  Oxycodone Hcl 10 Mg Tabs (Oxycodone hcl) .Marland Kitchen.. 1 tab by mouth q 6 hrs as needed severe pain 4)  Neurontin 300 Mg Caps (Gabapentin) .... Take 1 cap by mouth three times a day 5)  Remeron 30 Mg Tabs (Mirtazapine) .Marland Kitchen.. 1 tab by mouth daily 6)  Zofran Odt 8 Mg Tbdp (Ondansetron) .Marland Kitchen.. 1 tab by mouth three times a day as needed nausea 7)  Prednisone (pak) 10 Mg Tabs (Prednisone) .... Take x 10 days as directed 8)  Flagyl 500 Mg Tabs (Metronidazole) .... 4 tabs by mouth x 1 dose  Patient Instructions: 1)  Increase Remeron to 30 mg/ day. 2)  Urine testing downstairs today. 3)  Will call you w/ results tomorrow. 4)  Will set up transvaginal u/s tomorrow downstairs. Prescriptions: FLAGYL 500 MG TABS (METRONIDAZOLE) 4 tabs by mouth x 1 dose  #4 tabs x 0    Entered and Authorized by:   Seymour Bars DO   Signed by:   Seymour Bars DO on 10/02/2009   Method used:   Electronically to        [facility] [phone]* (retail)       [address]       [place], [place]  [address]       Ph: [phone]       Fax: [phone]   RxID:   [id number] PREDNISONE (PAK) 10 MG TABS (PREDNISONE) take  x 10 days as directed  #1 x 0   Entered and Authorized by:   Seymour Bars DO   Signed by:   Seymour Bars DO on 10/02/2009   Method used:   Electronically to        [facility] [phone]* (retail)       [address]       [place], [place]  [address]       Ph: [phone]       Fax: [phone]   RxID:   [id number] REMERON 30 MG TABS (MIRTAZAPINE) 1 tab by mouth daily  #30 x 3   Entered and Authorized by:   Seymour Bars DO   Signed by:   Seymour Bars DO on 10/02/2009   Method used:   Electronically to        [facility] [phone]* (retail)       [address]       [place], [place]  [address]       Ph: [phone]       Fax: [phone]   RxID:   [id number]

## 2010-06-17 NOTE — Progress Notes (Signed)
Summary: Refill Question - jr   Phone Note Call from Patient   Caller: Patient Summary of Call: Pt. called and was wondering since she got her last Xantex Rx. on the 1st of April, Can she have them refill today since the 1st of May is this Sunday when we are closed. Called pt. back at [phone] Initial call taken by: Michaelle Copas,  September 13, 2009 11:00 AM  Follow-up for Phone Call        Rx is ready for Pt to pick up. Follow-up by: Payton Spark CMA,  September 13, 2009 11:14 AM

## 2010-06-17 NOTE — Progress Notes (Signed)
Summary: Refill Request  Phone Note Refill Request   Refills Requested: Medication #1:  ALPRAZOLAM 1 MG TABS 1 tab by mouth three times a day as needed anxiety  Medication #2:  OXYCODONE HCL 10 MG TABS 1 tab by mouth q 6 hrs as needed severe pain Initial call taken by: Payton Spark CMA,  September 13, 2009 8:58 AM    Prescriptions: OXYCODONE HCL 10 MG TABS (OXYCODONE HCL) 1 tab by mouth q 6 hrs as needed severe pain  #120 x 0   Entered and Authorized by:   Seymour Bars DO   Signed by:   Seymour Bars DO on 09/13/2009   Method used:   Print then Give to Patient   RxID:   [id number] ALPRAZOLAM 1 MG TABS (ALPRAZOLAM) 1 tab by mouth three times a day as needed anxiety  #90 x 0   Entered and Authorized by:   Seymour Bars DO   Signed by:   Seymour Bars DO on 09/13/2009   Method used:   Print then Give to Patient   RxID:   [id number]

## 2010-06-17 NOTE — Letter (Signed)
Summary: Generic Letter  [facility]  [address]   [place], [place] [address]   Phone: [phone]  Fax: [phone]    08/26/2009  [facility] [address] [place], [place]  [address]  Dear Ms. [facility],  This letter if to verify that you were seen in the office today and you will be able to have contact with your son by 08/29/2009.     Sincerely,    Seymour Bars DO

## 2010-06-17 NOTE — Progress Notes (Signed)
Summary: Narcotics and refills  Phone Note Call from Patient   Caller: Patient Summary of Call: Pt requested change in xanax, oxycodone and/or remeron strength/dose. Pt states her GI pain is worse and didn't want to accept any narcotics from GI doctor today. GI doctor did fax letter stating he didn't prescribe any narcs for Pt. I explained to Pt that you will have to review full notes from GI and decide what is best. Also explained that GI may need to take over oxycodone Rx if they do not agree w/ regimen. Pt states she will also schedule OV to discuss anxiety and depression for poss change in meds.  Initial call taken by: Payton Spark CMA,  August 12, 2009 4:30 PM

## 2010-06-17 NOTE — Consult Note (Signed)
Summary: [facility]  [facility]   Imported By: Lanelle Bal 08/26/2009 10:01:09  _____________________________________________________________________  External Attachment:    Type:   Image     Comment:   External Document

## 2010-06-17 NOTE — Progress Notes (Signed)
Summary: Early refills  Phone Note Call from Patient   Caller: Patient Summary of Call: Boyfriend [facility]) called asking for [place] to help Pt out w/ her pain and anxiety meds until her OV on Weds. I advised him that no early refills will be given and if Pt is in that much pain she needs to go to the ED. Pt and BF agreed but Pt will try to wait to OV.  Initial call taken by: Payton Spark CMA,  Sep 30, 2009 11:20 AM

## 2010-06-17 NOTE — Progress Notes (Signed)
Summary: Prednisone and drug screen  Phone Note Call from Patient   Caller: Patient [phone] Summary of Call: Pt states she only took prednisone for 1 day bc it made her very irritable and angry. I told Pt that this is common but would ask if you had anyother suggestions. Pt also wanted to know what the drug screen showed. Please advise. Initial call taken by: Payton Spark CMA,  Oct 08, 2009 10:26 AM  Follow-up for Phone Call        pt tested NEG for the drug she was being prescribed so her discharge still stands.  Stop prednsione if it is causing mood changes.  Follow-up by: Seymour Bars DO,  Oct 08, 2009 11:01 AM     Appended Document: Prednisone and drug screen [facility] informing Pt of the above

## 2010-06-17 NOTE — Progress Notes (Signed)
Summary: UDS  Phone Note Call from Patient   Caller: Patient Summary of Call: Pt states she her UDS may have been neg bc she had not taken any oxycodone for several days prior bc she ran out. Pt states she showed you the empty bottle and also discussed that [facility] may have stolen some of them bc she ran out early. Please advise. Initial call taken by: Payton Spark CMA,  Oct 09, 2009 12:26 PM  Follow-up for Phone Call        Patient's discharge still stands.  It was up to pt to be responsible for her meds and taken them as prescribed.  She can get a RF on the FRI but needs to find a new PCP. Follow-up by: Seymour Bars DO,  Oct 09, 2009 12:37 PM     Appended Document: UDS Pt aware

## 2010-07-28 LAB — DIFFERENTIAL
Basophils Absolute: 0 K/uL (ref 0.0–0.1)
Basophils Relative: 0 % (ref 0–1)
Eosinophils Absolute: 0.2 K/uL (ref 0.0–0.7)
Eosinophils Relative: 3 % (ref 0–5)
Lymphocytes Relative: 51 % — ABNORMAL HIGH (ref 12–46)
Lymphs Abs: 3.4 K/uL (ref 0.7–4.0)
Monocytes Absolute: 0.5 K/uL (ref 0.1–1.0)
Monocytes Relative: 7 % (ref 3–12)
Neutro Abs: 2.7 10*3/uL (ref 1.7–7.7)
Neutrophils Relative %: 40 % — ABNORMAL LOW (ref 43–77)

## 2010-07-28 LAB — URINALYSIS, ROUTINE W REFLEX MICROSCOPIC
Bilirubin Urine: NEGATIVE
Glucose, UA: NEGATIVE mg/dL
Hgb urine dipstick: NEGATIVE
Ketones, ur: NEGATIVE mg/dL
Nitrite: NEGATIVE
Protein, ur: NEGATIVE mg/dL
Specific Gravity, Urine: 1.022 (ref 1.005–1.030)
Urobilinogen, UA: 1 mg/dL (ref 0.0–1.0)
pH: 6.5 (ref 5.0–8.0)

## 2010-07-28 LAB — COMPREHENSIVE METABOLIC PANEL WITH GFR
ALT: 45 U/L — ABNORMAL HIGH (ref 0–35)
AST: 40 U/L — ABNORMAL HIGH (ref 0–37)
Albumin: 4.2 g/dL (ref 3.5–5.2)
CO2: 29 meq/L (ref 19–32)
Chloride: 105 meq/L (ref 96–112)
Creatinine, Ser: 0.64 mg/dL (ref 0.4–1.2)
GFR calc Af Amer: >60 mL/min (ref >60)
GFR calc non Af Amer: >60 mL/min (ref >60)
Sodium: 142 meq/L (ref 135–145)
Total Bilirubin: 0.5 mg/dL (ref 0.3–1.2)

## 2010-07-28 LAB — COMPREHENSIVE METABOLIC PANEL
Alkaline Phosphatase: 74 U/L (ref 39–117)
BUN: 6 mg/dL (ref 6–23)
Calcium: 9.3 mg/dL (ref 8.4–10.5)
Glucose, Bld: 87 mg/dL (ref 70–99)
Potassium: 3.2 mEq/L — ABNORMAL LOW (ref 3.5–5.1)
Total Protein: 6.8 g/dL (ref 6.0–8.3)

## 2010-07-28 LAB — CBC
HCT: 44.3 % (ref 36.0–46.0)
Hemoglobin: 15.3 g/dL — ABNORMAL HIGH (ref 12.0–15.0)
MCH: 31.3 pg (ref 26.0–34.0)
MCHC: 34.5 g/dL (ref 30.0–36.0)
MCV: 90.6 fL (ref 78.0–100.0)
Platelets: 137 10*3/uL — ABNORMAL LOW (ref 150–400)
RBC: 4.89 MIL/uL (ref 3.87–5.11)
RDW: 12.6 % (ref 11.5–15.5)
WBC: 6.7 K/uL (ref 4.0–10.5)

## 2010-07-28 LAB — LIPASE, BLOOD: Lipase: 18 U/L (ref 11–59)

## 2010-07-29 LAB — POCT PREGNANCY, URINE: Preg Test, Ur: NEGATIVE

## 2010-07-29 LAB — URINALYSIS, ROUTINE W REFLEX MICROSCOPIC
Glucose, UA: NEGATIVE mg/dL
Hgb urine dipstick: NEGATIVE
Ketones, ur: NEGATIVE mg/dL
Protein, ur: NEGATIVE mg/dL
pH: 6.5 (ref 5.0–8.0)

## 2010-07-29 LAB — DIFFERENTIAL
Basophils Relative: 0 % (ref 0–1)
Eosinophils Absolute: 0.1 10*3/uL (ref 0.0–0.7)
Lymphs Abs: 2.3 10*3/uL (ref 0.7–4.0)
Monocytes Absolute: 0.3 10*3/uL (ref 0.1–1.0)
Monocytes Relative: 6 % (ref 3–12)

## 2010-07-29 LAB — COMPREHENSIVE METABOLIC PANEL
ALT: 40 U/L — ABNORMAL HIGH (ref 0–35)
Albumin: 4 g/dL (ref 3.5–5.2)
Alkaline Phosphatase: 55 U/L (ref 39–117)
GFR calc Af Amer: >60 mL/min (ref >60)
Potassium: 3.7 mEq/L (ref 3.5–5.1)
Sodium: 141 mEq/L (ref 135–145)
Total Protein: 6.6 g/dL (ref 6.0–8.3)

## 2010-07-29 LAB — CBC
HCT: 42.3 % (ref 36.0–46.0)
Platelets: 114 10*3/uL — ABNORMAL LOW (ref 150–400)
RDW: 12.7 % (ref 11.5–15.5)
WBC: 5.2 10*3/uL (ref 4.0–10.5)

## 2010-08-10 ENCOUNTER — Emergency Department (HOSPITAL_COMMUNITY): Payer: Self-pay

## 2010-08-10 ENCOUNTER — Emergency Department (HOSPITAL_COMMUNITY)
Admission: EM | Admit: 2010-08-10 | Discharge: 2010-08-11 | Disposition: A | Discharge status: Home / Self Care | Payer: Self-pay | Attending: Emergency Medicine | Admitting: Emergency Medicine

## 2010-08-10 DIAGNOSIS — F319 Bipolar disorder, unspecified: Secondary | ICD-10-CM | POA: Insufficient documentation

## 2010-08-10 DIAGNOSIS — R112 Nausea with vomiting, unspecified: Secondary | ICD-10-CM | POA: Insufficient documentation

## 2010-08-10 DIAGNOSIS — Z8619 Personal history of other infectious and parasitic diseases: Secondary | ICD-10-CM | POA: Insufficient documentation

## 2010-08-10 DIAGNOSIS — R197 Diarrhea, unspecified: Secondary | ICD-10-CM | POA: Insufficient documentation

## 2010-08-10 DIAGNOSIS — F411 Generalized anxiety disorder: Secondary | ICD-10-CM | POA: Insufficient documentation

## 2010-08-10 DIAGNOSIS — R10816 Epigastric abdominal tenderness: Secondary | ICD-10-CM | POA: Insufficient documentation

## 2010-08-10 DIAGNOSIS — Z79899 Other long term (current) drug therapy: Secondary | ICD-10-CM | POA: Insufficient documentation

## 2010-08-10 DIAGNOSIS — G40909 Epilepsy, unspecified, not intractable, without status epilepticus: Secondary | ICD-10-CM | POA: Insufficient documentation

## 2010-08-10 DIAGNOSIS — R141 Gas pain: Secondary | ICD-10-CM | POA: Insufficient documentation

## 2010-08-10 DIAGNOSIS — R142 Eructation: Secondary | ICD-10-CM | POA: Insufficient documentation

## 2010-08-10 DIAGNOSIS — K5090 Crohn's disease, unspecified, without complications: Secondary | ICD-10-CM | POA: Insufficient documentation

## 2010-08-10 DIAGNOSIS — R1013 Epigastric pain: Secondary | ICD-10-CM | POA: Insufficient documentation

## 2010-08-10 DIAGNOSIS — N898 Other specified noninflammatory disorders of vagina: Secondary | ICD-10-CM | POA: Insufficient documentation

## 2010-08-10 LAB — URINE MICROSCOPIC-ADD ON

## 2010-08-10 LAB — URINALYSIS, ROUTINE W REFLEX MICROSCOPIC
Protein, ur: NEGATIVE mg/dL
Specific Gravity, Urine: 1.026 (ref 1.005–1.030)
Urobilinogen, UA: 1 mg/dL (ref 0.0–1.0)

## 2010-08-11 LAB — COMPREHENSIVE METABOLIC PANEL
AST: 27 U/L (ref 0–37)
Albumin: 3.7 g/dL (ref 3.5–5.2)
BUN: 14 mg/dL (ref 6–23)
Calcium: 8.8 mg/dL (ref 8.4–10.5)
Chloride: 108 mEq/L (ref 96–112)
Creatinine, Ser: 0.72 mg/dL (ref 0.4–1.2)
GFR calc Af Amer: >60 mL/min (ref >60)
Total Bilirubin: 0.3 mg/dL (ref 0.3–1.2)
Total Protein: 7 g/dL (ref 6.0–8.3)

## 2010-08-11 LAB — DIFFERENTIAL
Basophils Relative: 0 % (ref 0–1)
Eosinophils Absolute: 0.3 10*3/uL (ref 0.0–0.7)
Eosinophils Relative: 5 % (ref 0–5)
Lymphs Abs: 2.1 10*3/uL (ref 0.7–4.0)
Monocytes Absolute: 0.5 10*3/uL (ref 0.1–1.0)
Monocytes Relative: 8 % (ref 3–12)
Neutrophils Relative %: 53 % (ref 43–77)

## 2010-08-11 LAB — CBC
MCH: 31.1 pg (ref 26.0–34.0)
MCHC: 35.1 g/dL (ref 30.0–36.0)
MCV: 88.6 fL (ref 78.0–100.0)
Platelets: 154 10*3/uL (ref 150–400)
RBC: 4.47 MIL/uL (ref 3.87–5.11)
RDW: 13.1 % (ref 11.5–15.5)

## 2010-08-12 ENCOUNTER — Emergency Department (HOSPITAL_COMMUNITY)
Admission: EM | Admit: 2010-08-12 | Discharge: 2010-08-12 | Disposition: A | Discharge status: Home / Self Care | Payer: Self-pay | Attending: Emergency Medicine | Admitting: Emergency Medicine

## 2010-08-12 DIAGNOSIS — Z79899 Other long term (current) drug therapy: Secondary | ICD-10-CM | POA: Insufficient documentation

## 2010-08-12 DIAGNOSIS — G40909 Epilepsy, unspecified, not intractable, without status epilepticus: Secondary | ICD-10-CM | POA: Insufficient documentation

## 2010-08-12 DIAGNOSIS — R109 Unspecified abdominal pain: Secondary | ICD-10-CM | POA: Insufficient documentation

## 2010-08-12 DIAGNOSIS — F411 Generalized anxiety disorder: Secondary | ICD-10-CM | POA: Insufficient documentation

## 2010-08-12 DIAGNOSIS — R112 Nausea with vomiting, unspecified: Secondary | ICD-10-CM | POA: Insufficient documentation

## 2010-08-12 DIAGNOSIS — Z8639 Personal history of other endocrine, nutritional and metabolic disease: Secondary | ICD-10-CM | POA: Insufficient documentation

## 2010-08-12 DIAGNOSIS — Z862 Personal history of diseases of the blood and blood-forming organs and certain disorders involving the immune mechanism: Secondary | ICD-10-CM | POA: Insufficient documentation

## 2010-08-12 DIAGNOSIS — F319 Bipolar disorder, unspecified: Secondary | ICD-10-CM | POA: Insufficient documentation

## 2010-08-12 DIAGNOSIS — K5090 Crohn's disease, unspecified, without complications: Secondary | ICD-10-CM | POA: Insufficient documentation

## 2010-08-12 LAB — HEPATIC FUNCTION PANEL
ALT: 49 U/L — ABNORMAL HIGH (ref 0–35)
AST: 45 U/L — ABNORMAL HIGH (ref 0–37)
Albumin: 4.2 g/dL (ref 3.5–5.2)
Alkaline Phosphatase: 53 U/L (ref 39–117)
Total Protein: 7.8 g/dL (ref 6.0–8.3)

## 2010-08-12 LAB — DIFFERENTIAL
Basophils Relative: 0 % (ref 0–1)
Eosinophils Absolute: 0.2 10*3/uL (ref 0.0–0.7)
Lymphs Abs: 2.2 10*3/uL (ref 0.7–4.0)
Neutro Abs: 5.5 10*3/uL (ref 1.7–7.7)
Neutrophils Relative %: 66 % (ref 43–77)

## 2010-08-12 LAB — URINALYSIS, ROUTINE W REFLEX MICROSCOPIC
Glucose, UA: NEGATIVE mg/dL
Hgb urine dipstick: NEGATIVE
Protein, ur: NEGATIVE mg/dL
Specific Gravity, Urine: 1.022 (ref 1.005–1.030)
Urobilinogen, UA: 1 mg/dL (ref 0.0–1.0)

## 2010-08-12 LAB — CBC
Hemoglobin: 15.5 g/dL — ABNORMAL HIGH (ref 12.0–15.0)
Platelets: 169 10*3/uL (ref 150–400)
RBC: 4.92 MIL/uL (ref 3.87–5.11)
WBC: 8.4 10*3/uL (ref 4.0–10.5)

## 2010-08-12 LAB — BASIC METABOLIC PANEL
CO2: 26 mEq/L (ref 19–32)
Chloride: 106 mEq/L (ref 96–112)
GFR calc Af Amer: >60 mL/min (ref >60)
Potassium: 4 mEq/L (ref 3.5–5.1)

## 2010-08-12 LAB — LIPASE, BLOOD: Lipase: 20 U/L (ref 11–59)

## 2010-08-12 LAB — RAPID URINE DRUG SCREEN, HOSP PERFORMED
Amphetamines: NOT DETECTED
Benzodiazepines: POSITIVE — AB
Cocaine: NOT DETECTED
Opiates: POSITIVE — AB
Tetrahydrocannabinol: NOT DETECTED

## 2010-08-28 LAB — CBC
HCT: 38.2 % (ref 36.0–46.0)
Hemoglobin: 13.3 g/dL (ref 12.0–15.0)
MCV: 94.9 fL (ref 78.0–100.0)
RBC: 4.02 MIL/uL (ref 3.87–5.11)
WBC: 5.4 10*3/uL (ref 4.0–10.5)

## 2010-08-28 LAB — POCT TOXICOLOGY PANEL
Benzodiazepines: POSITIVE
TCA Scrn: POSITIVE

## 2010-08-28 LAB — COMPREHENSIVE METABOLIC PANEL
BUN: 11 mg/dL (ref 6–23)
CO2: 24 mEq/L (ref 19–32)
Chloride: 107 mEq/L (ref 96–112)
Creatinine, Ser: 0.6 mg/dL (ref 0.4–1.2)
GFR calc non Af Amer: >60 mL/min (ref >60)
Glucose, Bld: 84 mg/dL (ref 70–99)
Total Bilirubin: 1.3 mg/dL — ABNORMAL HIGH (ref 0.3–1.2)

## 2010-08-28 LAB — URINALYSIS, ROUTINE W REFLEX MICROSCOPIC
Bilirubin Urine: NEGATIVE
Nitrite: NEGATIVE
Protein, ur: 30 mg/dL — AB
Specific Gravity, Urine: 1.014 (ref 1.005–1.030)
Urobilinogen, UA: 0.2 mg/dL (ref 0.0–1.0)

## 2010-08-28 LAB — DIFFERENTIAL
Basophils Absolute: 0.1 10*3/uL (ref 0.0–0.1)
Basophils Relative: 1 % (ref 0–1)
Eosinophils Relative: 1 % (ref 0–5)
Lymphocytes Relative: 35 % (ref 12–46)
Neutro Abs: 3.1 10*3/uL (ref 1.7–7.7)

## 2010-08-28 LAB — URINE MICROSCOPIC-ADD ON

## 2010-08-28 LAB — PREGNANCY, URINE: Preg Test, Ur: NEGATIVE

## 2010-09-01 LAB — URINALYSIS, ROUTINE W REFLEX MICROSCOPIC
Nitrite: NEGATIVE
Specific Gravity, Urine: 1.02 (ref 1.005–1.030)
Urobilinogen, UA: 1 mg/dL (ref 0.0–1.0)

## 2010-09-01 LAB — URINE MICROSCOPIC-ADD ON

## 2010-09-02 LAB — COMPREHENSIVE METABOLIC PANEL
ALT: 80 U/L — ABNORMAL HIGH (ref 0–35)
Alkaline Phosphatase: 68 U/L (ref 39–117)
BUN: 9 mg/dL (ref 6–23)
CO2: 24 mEq/L (ref 19–32)
CO2: 18 mEq/L — ABNORMAL LOW (ref 19–32)
Calcium: 9.6 mg/dL (ref 8.4–10.5)
Creatinine, Ser: 0.66 mg/dL (ref 0.4–1.2)
GFR calc non Af Amer: >60 mL/min (ref >60)
Glucose, Bld: 114 mg/dL — ABNORMAL HIGH (ref 70–99)
Glucose, Bld: 101 mg/dL — ABNORMAL HIGH (ref 70–99)
Potassium: 4.2 mEq/L (ref 3.5–5.1)
Sodium: 134 mEq/L — ABNORMAL LOW (ref 135–145)
Total Protein: 8.6 g/dL — ABNORMAL HIGH (ref 6.0–8.3)

## 2010-09-02 LAB — DIFFERENTIAL
Basophils Relative: 0 % (ref 0–1)
Eosinophils Absolute: 0 10*3/uL (ref 0.0–0.7)
Monocytes Absolute: 0.3 10*3/uL (ref 0.1–1.0)
Monocytes Relative: 5 % (ref 3–12)
Neutrophils Relative %: 73 % (ref 43–77)

## 2010-09-02 LAB — URINALYSIS, ROUTINE W REFLEX MICROSCOPIC
Bilirubin Urine: NEGATIVE
Glucose, UA: NEGATIVE mg/dL
Hgb urine dipstick: NEGATIVE
Ketones, ur: >80 mg/dL — AB
Protein, ur: 100 mg/dL — AB

## 2010-09-02 LAB — CBC
Hemoglobin: 15.9 g/dL — ABNORMAL HIGH (ref 12.0–15.0)
RBC: 4.97 MIL/uL (ref 3.87–5.11)
RDW: 13.2 % (ref 11.5–15.5)

## 2010-09-02 LAB — GLUCOSE, CAPILLARY: Glucose-Capillary: 113 mg/dL — ABNORMAL HIGH (ref 70–99)

## 2010-09-02 LAB — URINE MICROSCOPIC-ADD ON

## 2010-09-30 NOTE — H&P (Signed)
[name], [name]        ACCOUNT NO.:  [id number]   MEDICAL RECORD NO.:  [id number]          PATIENT TYPE:  INP   LOCATION:  5004                         FACILITY:  [facility]   PHYSICIAN:  Jordan Hawks. Elnoria Howard, MD    DATE OF BIRTH:  June 22, 1980   DATE OF ADMISSION:  05/16/2007  DATE OF DISCHARGE:                              HISTORY & PHYSICAL   REASON FOR ADMISSION:  Nausea, vomiting, dehydration.   PRIMARY CARE Venisa Frampton:  Gloriajean Dell. Andrey Campanile, M.D.   HISTORY OF PRESENT ILLNESS:  This is a 30 year old female with a past  medical history of hepatitis C, fibromyalgia, asthma, status post  appendectomy secondary to appendicitis and migraine headaches.  She was  admitted to the hospital with complaints of nausea, vomiting and  dehydration.  The patient was seen back in my office initially as a  consultation for her type of complaints.  Her symptoms started acutely  on the Monday before Thanksgiving, and progressively worsened over the  course of the the interval time.  She has lost approximately 12 pounds.  During the office evaluation she was noted to have some mild epigastric  pain, and she was subsequently taken to undergo an EGD.  Unfortunately  the EGD was unrevealing for any evidence of esophagitis or  gastroesophageal reflux disease.  In fact, she did not respond to  empiric treatment with a PPI.  She was then ordered a HIDA scan with  [facility].  Unfortunately, the patient's symptoms continued to worsen over the  Christmas holidays.  Overall she did well, however one day prior to  admission she had nausea and vomiting that was persistent every 15  minutes.  She subsequently presented to the office for further  evaluation and treatment because of her physical state and dehydration.  She was admitted to the hospital for further evaluation and treatment.   PAST MEDICAL AND SURGICAL HISTORY:  As stated above.   FAMILY HISTORY:  Noncontributory.   SOCIAL HISTORY:  The patient is married;  she has one child.  She smokes  half pack per day.  No alcohol or illicit drug use.   ALLERGIES:  NO KNOWN DRUG ALLERGIES.   REVIEW OF SYSTEMS:  As stated above in the history of present illness,  otherwise negative.   PHYSICAL EXAMINATION:  VITAL SIGNS:  Pending at this time.  GENERAL:  The patient is in no acute distress; however, she appears to  be weak and fatigued in appearance.  HEENT:  Normocephalic, atraumatic.  Extraocular muscles intact.  NECK:  Supple.  No lymphadenopathy.  LUNGS:  Clear to auscultation bilaterally.  CARDIOVASCULAR:  Regular rate and rhythm.  ABDOMEN:  Flat, soft; tender in the epigastrium and also the right upper  quadrant.  No rebound or rigidity.  Positive bowel sounds.  EXTREMITIES:  No clubbing, cyanosis or edema.   LABORATORY VALUES:  White blood cell count 4.7, hemoglobin 14.6, MCV  90.8, platelets at 175.  Sodium 138, potassium 4.4, chloride 106, CO2  26, glucose 77, BUN 15, creatinine 0.6.  Total bilirubin 1.1, alkaline  phosphatase 54, AST 56, ALT 74, albumin 4.5.   IMPRESSION:  1. Nausea  and vomiting.  2. Dehydration.   The etiology of her complaints is unknown at this time.  She has lost a  significant amount of weight and she is clinically dehydrated currently.   PLAN:  The patient does have some epigastric and right upper quadrant  discomfort, and the plan is for further evaluation of this area with a  HIDA with CCK. IV hydration of D5 normal saline.  N.P.O. status at this time.      Jordan Hawks Elnoria Howard, MD  Electronically Signed     PDH/MEDQ  D:  05/16/2007  T:  05/17/2007  Job:  [id number]   cc:   Gloriajean Dell. Andrey Campanile, M.D.

## 2010-09-30 NOTE — Consult Note (Signed)
NAMESHAELIN, [name]        ACCOUNT NO.:  [id number]   MEDICAL RECORD NO.:  [id number]          PATIENT TYPE:  INP   LOCATION:  5004                         FACILITY:  [facility]   PHYSICIAN:  Lindaann Slough, M.D.  DATE OF BIRTH:  Dec 31, 1980   DATE OF CONSULTATION:  05/21/2007  DATE OF DISCHARGE:                                 CONSULTATION   INDICATION FOR CONSULTATION:  Right hydronephrosis.   HISTORY OF PRESENT ILLNESS:  The patient is a 30 year old female who has  been complaining of nausea, vomiting, dehydration since about 6 weeks  ago.  Her symptoms started before Thanksgiving and gradually got worse,  and she has lost about 12 pounds.  She also complains of abdominal pain.  She has a history of hepatitis C, fibromyalgia.  She has no voiding  symptoms.  She moves her bowels every 2 to 3 days.  A CT scan on  May 18, 2007, showed normal kidneys.  Ultrasound of the abdomen on  May 20, 2007, showed mild right hydronephrosis and tiny gallstones.  I was then asked to see her for further evaluation of the  hydronephrosis.  She has no history of recurrent urinary tract  infections, and she denies any previous GU surgery.   PAST MEDICAL HISTORY:  Positive for hepatitis C, fibromyalgia, asthma,  migraine headaches.   PAST SURGICAL HISTORY:  Appendectomy and nose surgery.   SOCIAL HISTORY:  She is married, has one child.  Has smoked half a pack  a day for about 14 years and does not drink.   ALLERGIES:  NO KNOWN DRUG ALLERGIES.   FAMILY HISTORY:  There is no family history of hypertension or diabetes.   REVIEW OF SYSTEMS:  As noted in the HPI and everything else is negative.   PHYSICAL EXAMINATION:  GENERAL:  This is a well-developed 30 year old  female who is complaining of abdominal pain.  VITAL SIGNS:  Blood pressure is 107/52, pulse 77, respiration 18,  temperature 98.  She is alert and oriented to time, place and person.  SKIN:  Warm and dry.  ABDOMEN:  Soft.   It is tender in the right upper quadrant and left  flank.  She has no CVA tenderness.  Kidneys are not palpable.  She has  no hepatomegaly, no splenomegaly.  Bladder is not distended.  There is  no inguinal adenopathy.   A CT scan on May 18, 2007, showed normal kidneys.  Renal ultrasound  on May 20, 2007, showed mild right hydronephrosis.  There was no  evidence of renal or ureteral stone.  A BUN on May 20, 2007, was 4,  creatinine 0.62.  Sodium 141, potassium 4.0, hemoglobin 13, hematocrit  36.4, WBC 4.8.   IMPRESSION:  Mild right hydronephrosis.  Cholelithiasis.   PLAN:  I doubt that her hydronephrosis is significant since there was no  evidence of renal or ureteral obstruction on CT scan of May 18, 2007.  I will repeat renal ultrasound for further evaluation of the  hydronephrosis.      Lindaann Slough, M.D.  Electronically Signed     MN/MEDQ  D:  05/21/2007  T:  05/21/2007  Job:  [id number]

## 2010-09-30 NOTE — Consult Note (Signed)
[name], [name]        ACCOUNT NO.:  [id number]   MEDICAL RECORD NO.:  [id number]          PATIENT TYPE:  OBV   LOCATION:  6703                         FACILITY:  [facility]   PHYSICIAN:  Antonietta Breach, M.D.  DATE OF BIRTH:  1981-01-17   DATE OF CONSULTATION:  06/08/2007  DATE OF DISCHARGE:  06/09/2007                                 CONSULTATION   REASON FOR CONSULTATION:  Anxiety and opioid dependence.   REQUESTING PHYSICIAN:  Jordan Hawks. Elnoria Howard, MD   HISTORY OF PRESENT ILLNESS:  [name] is a 30 year old female  admitted to the [facility] on June 07, 2007 with nausea,  vomiting, dehydration, and abdominal pain.   The patient has 2-3 weeks of increasing worry, feeling on edge, and  muscle tension.   She lost her job, and she has had decreased social support.  She went  back to abusing opioid medication after being abstinent from heroin for  5 years.   She does have constructive future goals, intact interest.  She has no  thoughts of harming herself or others.  She is socially appropriate and  cooperative.   PAST PSYCHIATRIC HISTORY:  The patient used heroin and contracted  hepatitis C.  She entered into an abstinent period for 5 years, but then  began an opioid abusive behavior again, increasing her pain clinic  oxycodone to 15 mg t.i.d.   Her abdominal pain is similar to that that she used to have with heroin  withdrawal.   When she was in her heroin habit, she would use as many as 20 bags of  heroin per day.   She does have a long-term history of excessive worry, feeling on edge,  and muscle tension.   FAMILY PSYCHIATRIC HISTORY:  None known.   SOCIAL HISTORY:  Please see the above regarding opioid addiction  pattern.  The patient has one child.  She is married.  She is not using  alcohol.  No other illegal drugs.   PAST MEDICAL HISTORY:  1. Hepatitis C.  2. Asthma.  3. Fibromyalgia.  4. Migraine headaches.  5. Status post  cholecystectomy.  6. History of appendectomy.   ALLERGIES:  SULFA.   MEDICATIONS:  Ativan 2 mg q.4 h. p.r.n., which she is receiving  regularly.   LABORATORY DATA:  On May 27, 2007, WBC 5.8, hemoglobin 14.6, and  platelet count 147.  Sodium 140, BUN 5, creatinine 0.5, SGOT 64, and  SGPT 92.   REVIEW OF SYSTEMS:  Constitutional, head, eyes, ears, nose throat,  mouth, neurologic, psychiatric, cardiovascular, respiratory,  gastrointestinal, genitourinary, skin, musculoskeletal, hematologic,  lymphatic, endocrine, and metabolic, all unremarkable.   PHYSICAL EXAMINATION:  VITAL SIGNS:  Temperature 99.2, pulse 111,  respiratory rate 20, blood pressure 114/84, and O2 saturation on room  air 98%.  GENERAL APPEARANCE:  Ms. Desrocher is an adult female, lying in the  supine position in her hospital bed with no abnormal involuntary  movements.  MENTAL STATUS:  The the patient is alert.  Her attention span is within  normal limits.  She is oriented to all spheres.  Her mood is anxious.  Her affect is  anxious.  Her fund of knowledge and intelligence are  within normal limits.  Her speech involves normal rate and prosody  without dysarthria.  Thought process is logical, coherent, and goal  directed.  No looseness of associations.  Abstraction is intact.  Thought content, no thoughts of harming herself, no thoughts of harming  others, no delusions, and no hallucinations.  Insight is intact.  Judgment is intact.   ASSESSMENT:  AXIS I:  293.84, anxiety disorder, not otherwise specified.  Opioid dependence, the patient is not having any signs of opioid  withdrawal.  AXIS II:  Deferred.  AXIS III:  See past medical history.  AXIS IV:  Occupational primary support group.  AXIS V:  55.   Ms. Federici is not at risk to harm herself or others.  She agrees  to call Emergency Services immediately for any psychiatric emergency  symptoms.   The undersigned provided [facility] supportive  psychotherapy and education.  Trazodone was discussed for insomnia and anti-anxiety.  The patient has  been having significant insomnia in addition to her other anxiety  symptoms.   RECOMMENDATIONS:  We would start trazodone at 25-50 mg nightly and  titrate to an approximate dosage of 100 mg p.o. nightly.   This patient could benefit from a chemical dependency rehabilitation  program.  There are a number in the region including residential and  intensive outpatient.   She also could benefit from 12-step groups.   The patient could benefit from cognitive behavioral therapy combined  with deep breathing and progressive muscle relaxation in order to help  reduce her anxiety and decrease her need of Ativan.  The goal would be  for the Ativan to be discontinued, because it can be addictive and it  can result in physiologic tolerance with risk of withdrawal as well.      Antonietta Breach, M.D.  Electronically Signed     JW/MEDQ  D:  09/21/2007  T:  09/22/2007  Job:  [id number]

## 2010-09-30 NOTE — Assessment & Plan Note (Signed)
[name] is back regarding her pain.  She states that over the last month  or two her pain has been increased in her legs.  Her Percocet is not  lasting long enough.  She has also had quite a bit of increase in  anxiety due to her issues with her husband and his child from another  woman.  They seem to be having a lot of stress in their relationship as  a result.  He has put a lot of the burden on her to help orchestrate the  legal aspects of this case.  She feels like she is constantly forgetting  things and having more difficult times keeping up.  She does not use a  memory aid or book.  She has seen Dr. Leonides Cave for initial evaluation.  I  spoke to him briefly about things.  She is about to initiate  neuropsychological testing.  The Ritalin has not helped a great deal  with her attention by her account.  We are at 10 mg twice a day.  Keppra  is at 250 mg twice a day for neuropathic pain without significant  change.   The patient rates her pain as 7-8/10, describes it as sharp, stabbing,  constant, aching.  Pain interferes with general activity, relationships  with others and enjoyment of life on a moderate to severe level.  Sleep  is fair.   REVIEW OF SYSTEMS:  Notable for the above.  She does feel significantly  depressed by all the expectations and stresses heaped on her recently.   SOCIAL HISTORY:  As noted above.   PHYSICAL EXAM:  Blood pressure is 99/65, pulse 78, respiratory rate 22.  She is saturating 100% on room air.  The patient is pleasant, alert and oriented x3.  She seemed to be more  focused and attentive today.  She had a good organization of  her  thoughts.  I did not have to do much repetition today in our discussion,  which is a positive change.  HEART:  Regular.  CHEST:  Clear.  ABDOMEN:  Soft, nontender.  Lower extremity exam is nonspecific.   ASSESSMENT:  1. 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  2. 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   PLAN:  1. I think she is benefiting from the Ritalin so we will stay at the      10 mg twice a day dose.  2. We will increase Keppra to 500 mg b.i.d. for one week, then t.i.d.      thereafter.  3. Continue Tofranil and Percocet.  She was given 2 months'      prescriptions for all controlled substances today.  4. Continue to follow up with Dr. Leonides Cave for cognition and mood.  5. I instructed her that I wanted her to start using a memory      book/organizer.  She also needs to stand up for herself in regard      to her husband at his expectations.  It sounds as if he is dumping      a lot on her that should not be her responsibility at this point.  6. I will see her back in about 2 months' time.  7. Initiated cymbalta trial for mood/anxiety/pain      Ranelle Oyster, M.D.  Electronically Signed  ZTS/MedQ  D:  09/30/2007 12:33:49  T:  09/30/2007 12:49:17  Job #:  [id number]   cc:   Olene Craven, M.D.  Fax: [phone]   Jordan Hawks. Elnoria Howard, MD  Fax: [phone]

## 2010-09-30 NOTE — Op Note (Signed)
NAMENAHAL, WANLESS        ACCOUNT NO.:  [id number]   MEDICAL RECORD NO.:  [id number]          PATIENT TYPE:  INP   LOCATION:  5004                         FACILITY:  [facility]   PHYSICIAN:  Adolph Pollack, M.D.DATE OF BIRTH:  [birth year]   DATE OF PROCEDURE:  05/24/2007  DATE OF DISCHARGE:                               OPERATIVE REPORT   PREOPERATIVE DIAGNOSIS:  Symptomatic cholelithiasis.   POSTOPERATIVE DIAGNOSIS:  Symptomatic cholelithiasis.   PROCEDURE:  Laparoscopic cholecystectomy and intraoperative  cholangiogram.   SURGEON:  Adolph Pollack, M.D.   ASSISTANT:  Letha Cape, PA.   ANESTHESIA:  General.   INDICATIONS:  This is a 30 year old female who has been having problems  with epigastric pain and nausea and vomiting.  She is had a EGD which is  unrevealing.  HIDA scan was also unrevealing up.  These symptoms  continued to worsen.  She was brought to the hospital and has had  further significant evaluation by Jordan Hawks. Elnoria Howard, MD  and we had been  asked to see her.  She had and ultrasound which demonstrated  cholelithiasis.  She has been on high-dose narcotics and had a gastric  emptying scan which demonstrated delayed emptying however, she had been  outpatient on Reglan and this did not help her.  After discussion with  Dr. Elnoria Howard he felt that the narcotics were interfering with a gastric  emptying scan and after a long discussion with the patient we decided to  proceed with cholecystectomy.  She understands that this may not  alleviate her symptoms.   TECHNIQUE:  She was brought to the operating room, placed on the  operating table.  General anesthetic was administered.  The abdominal  wall was then sterilely prepped and draped.  Marcaine was infiltrated in  the subumbilical region.  A small subumbilical incision was made through  skin, subcutaneous tissue, fascial layers and peritoneum entering the  peritoneal cavity under direct vision.  A  pursestring suture of zero  Vicryl was placed around the fascial edges.  A Hasson trocar was  introduced into the peritoneal cavity and pneumoperitoneum was created  by insufflation of CO2 gas.   Following this a laparoscope was introduced.  I inspected the right  upper quadrant.  No acute inflammatory gallbladder changes were noted.  No liver abnormalities were noted.  Looking in the right lower quadrant  there were adhesions from a previous appendectomy.  There was no free  fluid present.  The stomach did not appear to be dilated.   She was placed in reverse Trendelenburg position with her right side  tilted up.  An 11-mm trocar was placed through an epigastric incision  and two 5 mm trocars were placed in the right mid lateral abdomen.  The  fundus of the gallbladder was grasped and retracted to the right  shoulder.  The infundibulum was grasped, retracted laterally and was  mobilized using blunt dissection on the gallbladder.  I identified the  cystic duct and created a window around it.  Likewise, the cystic artery  was identified and a window was created around it.  I then placed a clip  at the cystic duct gallbladder junction.  A small incision was made in  the cystic duct.  A Cholangiocath was placed the anterior abdominal wall  onto the cystic duct and cholangiogram was performed.   Under real-time fluoroscopy dilute contrast was injected into the cystic  duct.  Some of it extravasated from where the catheter went into the  cystic duct, but I was able to inject contrast enough to opacify the  hepatic ducts, as well as common bile duct and no obstruction in these  areas.  Contrast drained promptly into the duodenum.  Final report is  pending the radiologist's interpretation.   Following this the Cholangiocath was removed, the cystic duct was  clipped three times proximally and divided.  I then clipped the cystic  artery and divided it.  The gallbladder was dissected free from  liver  using electrocautery and placed in Endopouch bag.   The gallbladder fossa was irrigated.  Bleeding points were controlled  with electrocautery.  A piece of Surgicel was then placed in the  gallbladder fossa.  Irrigation fluid was evacuated.   Following this the gallbladder was removed through the subumbilical  incision and the subumbilical fascial defect closed by tightening up and  tying down the pursestring suture under laparoscopic vision.  The  remaining trocars were removed and pneumoperitoneum was released.  Before I did this I inspected the gallbladder fossa and no bleeding or  bile leak was noted.   Skin incisions were then closed with 4-0 Monocryl subcuticular stitches,  followed by Steri-Strips and sterile dressings.  She tolerated the  procedure well without any apparent complications and was taken to  recovery room in satisfactory condition.      Adolph Pollack, M.D.  Electronically Signed     TJR/MEDQ  D:  05/24/2007  T:  05/24/2007  Job:  [id number]   cc:   Jordan Hawks. Elnoria Howard, MD  Gloriajean Dell. Andrey Campanile, M.D.

## 2010-09-30 NOTE — Discharge Summary (Signed)
[name], Meza        ACCOUNT NO.:  [id number]   MEDICAL RECORD NO.:  [id number]          PATIENT TYPE:  INP   LOCATION:  5004                         FACILITY:  [facility]   PHYSICIAN:  Jordan Hawks. Elnoria Howard, MD    DATE OF BIRTH:  May 24, 1980   DATE OF ADMISSION:  05/16/2007  DATE OF DISCHARGE:  05/26/2007                               DISCHARGE SUMMARY   ADMISSION DIAGNOSES:  1. Nausea and vomiting.  2. Dehydration.  3. Abdominal pain.   DISCHARGE DIAGNOSES:  1. Gastroparesis.  2. Small gallstones, status post laparoscopic cholecystectomy.   CONSULTATION:  Surgeon.   HISTORY AND PHYSICAL:  See the original H&P for full details.   HOSPITAL COURSE:  The patient was admitted to the hospital and started  on aggressive IV hydration.  She had mild improvement with dehydration  and subsequently, because of the persistence of her severe abdominal  pain, she was started on a Dilaudid PCA.  Just prior to this time, she  underwent a HIDA scan with [facility].  Unfortunately, there was no  visualization initially of the gallbladder; however, after a morphine  injection there was noted patency of the cystic duct.  Unfortunately, no  etiology could be found with this test.  Therefore, a CT scan was  performed and a surgical consultation was requested.  The CT scan was  negative for any acute findings, although there was some mild fluid  accumulation at the periportal region and a small amount of ascites in  the pelvis.  Otherwise, no acute pathologic findings could be  identified.  Surgical consultation was requested, and an abdominal  ultrasound was performed.  The ultrasound did reveal tiny gallstones;  however, the patient's history was equivocal for a gallbladder etiology.  After extensive discussion with the patient in regards to the risks and  benefits of the cholecystectomy, she desired to have this procedure.  She felt that it may give her a chance of benefit with the removal of  her  gallbladder.  The patient subsequently underwent a laparoscopic  cholecystectomy after undergoing a gastric emptying scan which was  revealed to have a 60% retention at 2 hours.  It is uncertain if the  cause of the patient's abdominal pain was equivocal; however, a  gallbladder source was ruled out with a cholecystectomy.  After the  cholecystectomy, the patient complained about postsurgical pain, but she  denied having any types of admitting abdominal pain.  She is on a  significant amount of narcotic medications and continued to request the  medications.  She was frustrated with the nursing staff in regards to  the prominence of providing her with the pain medications.   At the time of discharge, she is able tolerate a soft PO diet without  any difficulty, and it was felt that she could be safely discharged  home.  The patient was discharged home with a short course of Dilaudid,  Ativan and her prior chronic narcotic medications of oxycodone and  Lyrica.  Plan is for the patient to follow up with Dr. Abbey Chatters in 2-3  weeks and to follow up with Dr. Elnoria Howard in 1-2 weeks.  Jordan Hawks Elnoria Howard, MD  Electronically Signed     PDH/MEDQ  D:  05/26/2007  T:  05/26/2007  Job:  [id number]   cc:   Adolph Pollack, M.D.

## 2010-09-30 NOTE — Procedures (Signed)
EEG NUMBER:  [id number]   CLINICAL HISTORY:  This is a 30 year old with an episode of losing time  for approximately 30 seconds during driving.  She does not recall  anything.  The study is being done to look for the presence of seizure  activity, (780.02).   PROCEDURE:  The tracing was carried out on a 32-channel digital Cadwell  recorder reformatted to 16-channel montages with one devoted to EKG.  The patient was awake during the recording and asleep.  The  [employer] 10/20 system lead placement was used.  Medications  included Xanax and Vicodin.   DESCRIPTION OF FINDINGS:  Dominant frequency is a 20-35 microvolt, 10-Hz  activity that is well regulated with frontally predominant beta range  activity.  The patient becomes drowsy and drifts into natural sleep with  vertex sharp waves and symmetric and synchronous sleep spindles  superimposed upon the synchronized delta range background.   Photic stimulation induced a driving response between 3 and 17 Hz.  Hyperventilation caused no significant change.   EKG showed a sinus rhythm with ventricular response of 54 beats per  minute.   IMPRESSION:  Normal waking record.      Deanna Artis. Sharene Skeans, M.D.  Electronically Signed     JWJ:XBJY  D:  10/04/2006 19:50:28  T:  10/05/2006 09:02:53  Job #:  [id number]   cc:   Rene Kocher, M.D.  Fax: [phone]

## 2010-09-30 NOTE — Assessment & Plan Note (Signed)
[name] is back regarding her pain.  She came off of the Lyrica as she  noted she had some rapid heart rate and some jitteriness.  She states  that has gotten better off of the medications.  We did nerve  conduction/EMG studies last visit, and only abnormalities were decreased  voluntary recruitment on EMG.  She elucidated that she had a brain  injury at the age of 60.  It was apparently severe injury and she was in  the hospital for some time, at least for a few weeks.  She remembers  having some weakness associated with it, et Karie Soda.  She has had some  psychiatric diagnoses, such as bipolar and oppositional defiance  disorder apparently along with it, after the injury, although I am not  sure that the people who were working with her at the time were aware of  her injury.  [name] reports problems with attention and details, as  well as memory.  She has problems multitasking.  She notes that there  have been some psychosocial issues surrounding her husband and the  possibility of a child by another woman prior to their relationship that  has come about.  She feels that the anxiety over this has worsened her  sleep, as well as pain, and certainly, her memory.   The patient remains on Tofranil p.m. 150 mg at bedtime, as well as  oxycodone for breakthrough pain.  She uses Skelaxin p.r.n. for spasm.  The patient rates her pain 7/10 to 8/10 today.  Sleep is poor due to  issues noted above.   REVIEW OF SYSTEMS:  Notable for the trouble walking, confusion,  depression.  Full review is in the written health and history section of  the chart.   SOCIAL HISTORY:  As noted above.   PHYSICAL EXAM:  Blood pressure is 137/85, pulse 102, respiratory rate  18.  She is satting 100% on room air.  The patient is pleasant, alert and oriented x3.  Affect is bright and  appropriate.  Gait is generally stable.  She has a limp a bit to the  right.  No skin breakdown was seen.  Cognitively, she was a bit  anxious.  Cognitively, she is a bit anxious and jumps from topic to topic, but is  able to focus and put her attention forth at times.  Did have to repeat  things at times to her today.  HEART:  Still tachycardic.  CHEST:  Clear.  She was well-kept and alert.   ASSESSMENT:  1. Extremity pain, which may be a centralized pain due to prior brain      injury.  2. Memory and attentional deficits secondary to above.   PLAN:  1. Will begin a trial of Keppra 250 mg nightly x1 week then b.i.d. for      pain symptoms.  The patient will continue on Tofranil and p.r.n.      Percocet.  I gave her 2 month prescriptions for these today.  2. Will begin trial of Ritalin for attentional deficits to see if this      helps with memory and comprehension.  Begin 5 mg daily at 0700 and      1200 hours.  She was given a second month refill for this as well.  3. Will make a referral to Dr. Eula Flax for neuropsychological      testing in light of the above.  It might be useful for me, as well  as the patient, to understand formally some of her cognitive      deficits, especially      considering the fact that others have pawned her problems off on      purely psychiatric issues.  4. I will see her back in about 2 months' time.      Ranelle Oyster, M.D.  Electronically Signed     ZTS/MedQ  D:  08/05/2007 14:33:18  T:  08/05/2007 16:36:47  Job #:  [id number]   cc:   Olene Craven, M.D.  Fax: [phone]   Jordan Hawks. Elnoria Howard, MD  Fax: [phone]

## 2010-09-30 NOTE — H&P (Signed)
[name], [name]        ACCOUNT NO.:  [id number]   MEDICAL RECORD NO.:  [id number]          PATIENT TYPE:  INP   LOCATION:  6703                         FACILITY:  [facility]   PHYSICIAN:  Jordan Hawks. Elnoria Howard, MD    DATE OF BIRTH:  March 12, 1981   DATE OF ADMISSION:  06/07/2007  DATE OF DISCHARGE:                              HISTORY & PHYSICAL   PRIMARY CARE PHYSICIAN:  Dr. Benedetto Goad   REASON FOR ADMISSION:  Nausea, vomiting, dehydration and abdominal pain.   HISTORY OF PRESENT ILLNESS:  This is a 30 year old female with a past  medical history of hepatitis C, fibromyalgia, asthma, status-post  appendectomy, migraine headaches and status-post cholecystectomy and  prior history of narcotic abuse who is admitted to the hospital with  complaints of nausea, vomiting and dehydration.  The patient currently  stated that she was doing well after the cholecystectomy; however, she  started to have resumption of nausea and vomiting when she ran out of  her pain medications.  She reported that she was not able to eat since  the Saturday before this admission and subsequently she is not able to  function and she was admitted for IV hydration.   Collateral information was obtained from her mother in that she has a  prior history of narcotic abuse; however she has been clean and sober  for the past 5 years with respect to the use of narcotics; however her  mother stated that she had  lost her job approximately 5 months ago and  this predated the worsening of her symptoms which was initially  evaluated in the office. Subsequently she was seen by Dr. Hermelinda Medicus at  the pain clinic and she was started initially on Vicodin, subsequently  increased to oxycodone where she takes 15 mg t.i.d. The patient was  previously admitted under my care for approximately 1 week with  complaints of abdominal pain.  During the duration of her  hospitalization she received a copious amount of narcotic medications,  which did not reveal result in any significant improvement in her  symptoms.  There is a thought that this may be from a gallbladder  standpoint.  She was noted to have small gallstones; however clinically  it was not apparent that this was the cause; however.  At that time it  was felt that biliary colic was a possible consideration and gastric  emptying scan was also done at that time and showed mild gastroparesis.  The patient was forthcoming with her prior history of narcotic abuse and  subsequently she had agreed to undergo the cholecystectomy. In fact that  was her wish to undergo that procedure.  Unfortunately there is no  significant improvement in any of her symptoms.  She was discharged to  home on narcotic medications and  apparently when she ran out of the  medication she started to have withdrawal type of symptoms, which  precipitated this hospital admission.  Her mother states that the  symptoms that she is currently experiencing are exactly the same as  those that she  experienced when she was on heroin withdrawal.  She was  using approximately  20 bags of heroin previously at the height of her  drug abuse.   PAST MEDICAL HISTORY:  As stated above.   PAST SURGICAL HISTORY:  As stated above.   FAMILY HISTORY:  Noncontributory.   SOCIAL HISTORY:  The patient is married, has 1 child, prior illicit drug  use as stated above.  No alcohol.  She does smoke a half-pack per day.   ALLERGIES:  NO KNOWN DRUG ALLERGIES.   REVIEW OF SYSTEMS:  As stated above in history of present illness.   PHYSICAL EXAMINATION:  VITAL SIGNS:  Blood pressure 107/74, heart rate  80, respirations 20, temperature is 98, pulse oximetry is 98% on room  air.  GENERAL:  The the patient appears to be weak.  She states that she feels  terribly.  HEENT: Normocephalic, atraumatic.  Extraocular ocular muscles intact.  NECK:  Supple with no lymphadenopathy.  LUNGS:  Clear to auscultation bilaterally.   CARDIOVASCULAR:  Regular rate and rhythm.  ABDOMEN:  Flat, soft, nontender, nondistended.  Positive bowel sounds.  EXTREMITIES:  No clubbing, cyanosis or edema.   LABORATORY VALUES:  None ordered at this time.   IMPRESSION:  1. History of narcotic abuse.  2. Signs and symptoms of narcotic withdrawal. At the time that I      agreed to admit the patient I had suspicious that she may have had      more of a narcotic use in the past and this was confirmed by the      mother independently calling me and confirmed my suspicions with      her prior history.  At this time there is no evidence of any      medical abdominal pain.  She does have some signs and symptoms of      withdrawal symptoms and her symptoms are ameliorated with Ativan.      At this time she is hemodynamically stable and she was able to      tolerate a regular p.o. diet upon my arrival, therefore her      complaints of not being able to tolerate n.p.o. intake for several      days is completely erroneous.  The patient has significant issues      in regards with coping mechanisms and unfortunately the resumption      of narcotic medications has most likely triggered a prior      physiologist response in regards to her prior addition to narcotic      medications.  At this time I have made strict orders that she would      not receive any narcotics in regards to this hospitalization nor      will she be discharged with any narcotic medications.  I strongly      encourage her to followup with her pain clinic physician, Dr.      Hermelinda Medicus upon her discharge and I will also discuss the situation      with her pain clinic physician.   PLAN:  1. IV hydrate at this time and monitor for any further signs of      symptoms of withdrawal. Continue with Ativan.  2. Request a psychiatric consult in regard to treating for any further      withdrawal symptoms and also to assist in finding appropriate      followup care from a psychiatric  standpoint.      Jordan Hawks Elnoria Howard, MD  Electronically Signed     PDH/MEDQ  D:  06/08/2007  T:  06/09/2007  Job:  [id number]   cc:   Gloriajean Dell. Andrey Campanile, M.D.

## 2010-09-30 NOTE — Assessment & Plan Note (Signed)
[name] is back regarding her leg pain.  She states that the Lyrica has  helped with her pain in a general standpoint.  Her pain is down to a  6/10.  She was in the hospital for a gall bladder removal in December,  and then again with nausea in January.  I spoke to Dr. Elnoria Howard, who found  no specific abnormalities.  Dann mentioned something about delayed  gastric emptying, but no medication changes were noted.  I gave her some  Ultram over the phone earlier this week and she does not note that it  helped a great deal.  We received a call from her husband, who is  concerned about her abuse of medications.  [name] states that was in  reference to her using Phenergan for the nausea as opposed to the pain  medication itself.  Oxycodone has helped somewhat.  We still have no  specific reason for her leg symptoms.  Lab workup and MRI have been  normal.  She continues to walk and move without specific problem.  It  sounds as if things are a bit stressful at home on occasion.  She  describes her pain as tingling, stabbing and aching sometimes.  It is  most prominent in the thighs going down in the feet.  She denies  numbness in her feet per se.  Pain interferes with her general activity,  relations with others and enjoyment of life on a moderate level.   REVIEW OF SYSTEMS:  Notable for the above as well as trouble walking and  some subjective weakness.  Nausea has been a bit better since leaving  the hospital last time, but nothing new otherwise.   SOCIAL HISTORY:  Patient is married, living with her husband and child.  Her mother and mother-in-law are involved in her care as well.  She  still smokes significantly.  She is smoking a half pack per day.   PHYSICAL EXAMINATION:  Blood pressure:  135/84.  Pulse:  135.  Respiratory rate:  18.  She is sating 98% on room air.  Patient is  pleasant, alert and oriented x3.  Her back exam is stable with full  flexion and extension.  She had normal motor  and sensory function in  both legs and 2+ reflexes throughout.  She has some subjective  tenderness to the thighs and hamstrings with deep palpation today.  There is no discoloration or temperature changes.  No skin breakdown was  noted.  She had normal upper extremity use on exam today.  Cognitively  she was appropriate.  She did not appear depressed or anxious.  Heart  was tachycardic.  Chest was clear otherwise.   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  She has responded to Lyrica somewhat.   PLAN:  1. Titrate Lyrica up to 100 mg t.i.d. over the next week.  2. Add Tofranil PM 75 mg nightly.  3. I gave her 5 mg oxycodone #60 for breakthrough pain.  I would like      to move away from the narcotic medications with her bowel history.  4. Skelaxin p.r.n. for spasms.  5. I will perform nerve conduction EMG in both lower extremities to      rule out neuropathy or myopathy although her history would not      indicate such a process.  6. Follow up with Dr. Elnoria Howard for GI issues.  7. I will see her back pending study.      Ranelle Oyster, M.D.  Electronically Signed     ZTS/MedQ  D:  06/14/2007 11:54:27  T:  06/14/2007 15:22:17  Job #:  [id number]   cc:   Olene Craven, M.D.  Fax: [phone]   Jordan Hawks. Elnoria Howard, MD  Fax: [phone]

## 2010-09-30 NOTE — Consult Note (Signed)
NAMEJON, [name]        ACCOUNT NO.:  [id number]   MEDICAL RECORD NO.:  [id number]          PATIENT TYPE:  INP   LOCATION:  1311                         FACILITY:  [facility]   PHYSICIAN:  John C. Madilyn Fireman, M.D.    DATE OF BIRTH:  August 21, 1980   DATE OF CONSULTATION:  03/28/2008  DATE OF DISCHARGE:                                 CONSULTATION   REASON FOR CONSULTATION:  Abdominal pain, nausea and vomiting.   HISTORY OF PRESENT ILLNESS:  The patient is a 30 year old white female  who presents with approximately 8 days' history of right-sided and  epigastric abdominal pain, nausea, vomiting and diarrhea with some  slight left lower quadrant pain.  She was seen at the emergency room on  March 20, 2008, and had an abdominal CT scan there which was basically  unrevealing except for some slightly small low-density lymph nodes in  the root of the mesentery felt to be __________ reactive and a 2.2 x 1.8  cm rim-enhancing lesion along the posterior margin of the left ovary.  She was started on Cipro and Flagyl, but her symptoms worsened, and she  was admitted 3 days later.  A followup ultrasound was felt to show a  complex follicle of the left ovary.  Patient was admitted.  She had 2  negative C. difficile toxins, negative amylase and lipase, normal WBC  count, temperature and urine pregnancy test.  She has not vomited as  much and actually is stating that she wants to eat but that her pain and  diarrhea are persistent.   She was admitted with abdominal pain, nausea and vomiting about a year  ago by Dr. Elnoria Howard after having an outpatient workup including an upper  endoscopy which was normal and failure to respond to proton pump  inhibitors.  She was found to have gallstones and had a cholecystectomy  and did well for awhile, but her symptoms relapsed, and she had to be  admitted again later in January 2009.  It was brought up at that time  that the patient had been a user of prescription  narcotics, and she  states that she continued to use them up until about 2 months ago.  She  had an abnormal gastric emptying study in February 2009 and then  slightly improved but still slightly delayed abnormal gastric emptying  study in April 2009.  She was on Reglan for awhile but went off it and  apparently did well throughout the summer and early fall when her  current symptoms began.   PAST MEDICAL HISTORY:  1. History of chronic pain syndrome.  2. History of somatization disorder, anxiety and depression.  3. Chronic hepatitis C.  4. History of migraine headache.  5. History of alcohol and drug abuse.  6. History of asthma.  7. Recurrent UTIs.   SURGERY:  Appendectomy and cholecystectomy.   SOCIAL HISTORY:  The patient is unemployed, has 1 child who is in the  custody of her father.  She has __________ pack year history of  cigarette smoking.  She currently does not drink alcohol.   PHYSICAL EXAMINATION:  GENERAL:  Well-developed, well-nourished  white  female in no acute distress.  HEART:  Regular rate and rhythm without murmur.  LUNGS:  Clear.  ABDOMEN:  Soft, nondistended with normoactive bowel sounds.  No  hepatosplenomegaly, mass or guarding.  There is tenderness in the  epigastrium and right upper quadrant with some guarding.   IMPRESSION:  1. Abdominal pain, nausea and vomiting, recurrent, in the setting of      multiple psychosocial issues.  2. History of substance dependency.   PLAN:  For now, we will limit workup to an upper GI with small bowel  series primarily to rule out inflammatory bowel disease.  We will also  get a tissue transglutaminase and H. pylori antibody.  We will obtain  stool for enteric pathogens Cryptosporidium and Giardia.  Further  recommendations to follow.           ______________________________  Everardo All Madilyn Fireman, M.D.     JCH/MEDQ  D:  03/28/2008  T:  03/28/2008  Job:  [id number]

## 2010-09-30 NOTE — Assessment & Plan Note (Signed)
Allsion is back regarding her chronic lower extremity pain.  She was  down in [place] over the summer with her mother and now back in town.  She has been seen by Psychology as well as a psychiatrist.  She did not  like the psychiatrist and is looking for other help.  She stated that  the psychiatrist felt that she was bipolar.  Kwanza has been out of her  medicines for 2-3 weeks (await the one that we have prescribed here).  She missed her appointment 2 weeks ago.  She is using trazodone now for  sleep.  She complains of pain in both legs, more so in the left thigh  and calf.  Sleep is fair.  She rates her pain a 7-8/10.  She describes  the pain is sharp, stabbing, constant, and aching.  She still has some  stressful issues just related to her husband.   REVIEW OF SYSTEMS:  Notable for anxiety and trouble walking as well as  weakness.  She reports some depression as well.  Full review is in the  written health and history section of the chart.  Other pertinent  positives are above.   SOCIAL HISTORY:  The patient is looking for a job currently.  She is  separated from her husband.   PHYSICAL EXAMINATION:  GENERAL:  The patient is pleasant, alert, and  oriented x3.  She continues to have some issues with focus and  attention, but cognitively is generally intact.  HEART:  Regular rhythm.  CHEST:  Clear.  Weight is stable.  The patient has some noticeable  antalgia on the left leg when she bears weight.  On examining the leg  today, it was hard to appreciate focal tenderness with palpation of the  muscle or bone.  She had give way weakness with knee extension and  flexion and ankle dorsiflexion and plantar flexion.  Reflexes were 2+.  Sensory exam was grossly intact.  No color changes were seen.  No  swelling or edema was appreciated.  She had intact back range of motion  today.  NEUROLOGIC:  Cranial nerves intact.  Sensory exam is normal.   ASSESSMENT:  1. Chronic lower extremity  pain without obvious source (questionable      central cause).  2. 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  3. 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   PLAN:  1. We will increase Klonopin to 1 mg q.12 hours and increase Effexor      XR to 150 mg every day to see if we can improve the psychological      component here.  2. We will give Relafen 500 b.i.d. for musculoskeletal peace with      Skelaxin p.r.n. for spasm.  3. Continue oxycodone for breakthrough pain 1 q.6 hours p.r.n., #60.      I really want to stay away from narcotics as I do not feel this is      the real issue here.  I think once she is more stable from a      psychosocial standpoint, her pain will improve as well.  4. We will see her back in 1 month with nursing and 2 months with me.      Ranelle Oyster, M.D.  Electronically Signed     ZTS/MedQ  D:  03/05/2008 15:30:27  T:  03/06/2008 00:20:53  Job #:  [id number]   cc:   Jordan Hawks. Elnoria Howard, MD  Fax:  [phone] 

## 2010-09-30 NOTE — Assessment & Plan Note (Signed)
[name] is back regarding her leg pain.  We scanned her back and found  no obvious abnormalities on the MRI.  Her lab testing also was negative  for any reactive process with normal uric acid, sed rate and a  rheumatoid factor CK and CRP levels.  She rates her pain still a 7-8 out  of 10.  She states that she was in and out of her doctor's office and  the ER requiring fluids after persistent nausea and vomiting.  She  states that no obvious source was found.  She did stop her Mobic prior  to this and still had some problems.  Was some thought of it being  related to her hepatitis C.  She comments to me that she has frequent  problems with constipation and this has been an issue over the years,  she does not believe it is the oxycodone she is taking because she has  been on that previously.  She did stop her doxycycline which may or may  not have been related to cessation of symptoms.  She is using some  Reglan on a p.r.n. basis, now 10 mg q.6 hours p.r.n.  Patient rates her  pain as 7-8 out of 10, describes it as stabbing, aching, sharp and  constant.  Pain interferes with her general activity, relations with  others, enjoyment of life on a moderate level.  Sleep is fair.  She  states that pain starts at the hips and radiates down into the feet and  is more painful with movement and particularly walking or bending.  She  also has intermittent symptoms in her arms and sometimes in her neck but  much less prominently.   REVIEW OF SYSTEMS:  Notable for numbness, tingling, weakness, trouble  walking, nausea, vomiting, weight loss, urine retention.  Other  pertinent positives listed above and full review is in the written  health and history section.   SOCIAL HISTORY:  Patient is married and living with her husband taking  care of the kids.   PHYSICAL EXAMINATION:  Blood pressure is 135/81, pulse is 100,  respiratory rate 18, she is sating 97% on room air.  Patient is  pleasant, alert and  oriented x3, she walks with an antalgic gait  favoring either side.  She is tender to touch from the greater  trochanters down to the feet with pain at the knees, lateral and medial  femoral condyles, ankles, etc.  She had some tenderness in the low back.  She had minimal tenderness in the neck and suboccipital regions today.  Arms were slightly tender at the shoulder and elbows.  She had no chest  or mid-back tenderness.  Strength was generally 5 out of 5.  Sensation  was grossly intact.  Cognitively she was appropriate.  HEART:  Tachycardic.  CHEST:  Clear.   ASSESSMENT:  Ongoing pain, particularly in the lower extremities.  I  still am not convinced that she has fibromyalgia.  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  All of her diagnostic without  has been negative thus far.  I would suspect that if she had a myositis  that we would be looking at an elevation in her creatine phosphokinase,  however this was not seen.  We still could consider EMG nerve conduction  studies as well.   PLAN:  1. I am going to treat her as a central/sensitized pain syndrome along      the lines of fibromyalgia.  We will begin her on Lyrica 50 mg      nightly titrating up to t.i.d. over 2 week's time.  2. Refill oxycodone 15 mg one q.8 hours p.r.n. for baseline pain      control.  She can stay with the Skelaxin as needed for now.  3. Continue off Mobic.  4. I will see her back in about a month's time.      Ranelle Oyster, M.D.  Electronically Signed     ZTS/MedQ  D:  04/29/2007 13:44:05  T:  04/30/2007 10:30:45  Job #:  [id number]   cc:   Olene Craven, M.D.  Fax: [phone]

## 2010-09-30 NOTE — Discharge Summary (Signed)
[name], DUIGNAN        ACCOUNT NO.:  [id number]   MEDICAL RECORD NO.:  [id number]          PATIENT TYPE:  INP   LOCATION:  1311                         [facility]ACILITY[facility]   PHYSICIAN:  Rosalyn Gess. Norins, MD  DATE OF BIRTH:  Oct 05, 1980   DATE OF ADMISSION:  03/23/2008  DATE OF DISCHARGE:  04/03/2008                               DISCHARGE SUMMARY   ADMISSION DIAGNOSES:  1. Abdominal pain with nausea and vomiting.  2. Bipolar depression.  3. Chronic pain syndrome.   DISCHARGE DIAGNOSES:  1. Nausea and vomiting, resolved.  2. Abdominal pain with no etiology determined  3. Chronic pain with patient back on her home regimen.  4. Chronic hepatitis C.  5. Left ovarian cyst.  6. History of recurrent urinary tract infections.   CONSULTANTS:  Dr. Madilyn Fireman and associates for GI.   PROCEDURES:  1. CT of the abdomen and pelvis, March 20, 2008, as an outpatient      which reveals a cluster of small __________ lymph nodes at the root      of the mesentery that were likely reactive with no pathologically      enlarged nodes.  No other abnormalities noted.  Pelvis with small      ring enhancing lesion at the posterior aspect of the left ovary      potentially representing a corpus luteum or hemorrhagic complex      cyst.  Small amount of free pelvic fluid.  Mild stranding along the      distal sigmoid colon and sigmoid colitis could not be readily      excluded.  Mild thickening of the urinary bladder raising the      possibility of cystitis.  2. Transvaginal ultrasound, March 26, 2008, which revealed IUD      within the endometrial cavity.  Uterine wall was normal.  Normal      right ovary was noted, probable complex follicle cyst in the left      ovary.  Regular ultrasound of the pelvis performed with same      findings.  3. Upper GI with small bowel follow through performed March 28, 2008, with unremarkable small bowel follow through.  Unremarkable      upper GI.  No  ulcer.  No small bowel obstruction.  No reflux.  4. Chest x-ray, March 30, 2008, with no active chest disease.  5. Colonoscopy, April 02, 2008, which was unremarkable except for      internal hemorrhoids.   HISTORY OF THE PRESENT ILLNESS:  The patient is a 30 year old woman  [name] 3 days prior to  admission, diagnosed with UTI and colitis, treated with metronidazole  and ciprofloxacin.  She established with a [place] physician the  following day and was maintained on antibiotic therapy.  She continued  to have intermittent but crampy abdominal pain describing it in the  right upper quadrant with radiation to the right flank and also the left  lower quadrant.  She reported multiple episodes of nausea, vomiting, and  diarrhea.  She reports increasing weakness with worsening pain  and  intractable nausea and vomiting, unable to tolerate p.o. intake.  She  denied hematemesis or hematochezia, fevers, or chills.   Patient has a history of chronic pain syndrome involving the lower  extremities and has been on chronic OxyContin.  Patient also reports a  history of hepatitis C.  She had undergone 1 month of treatment in  [place] but had to stop because of severe depression.  She had  failed to disclose a preexisting depression before treatment.   Because of the patient's increasing pain, discomfort, nausea, and  vomiting, she was admitted to the hospital.   Please see the H and P for past medical history, family history, and  initial examination.   HOSPITAL COURSE:  1. Abdominal pain.  Patient had a very thorough evaluation for      abdominal pain with studies as noted above.  Her left lower      quadrant pain could be attributed to an ovarian cyst.  Her right      upper quadrant pain was more likely to be associated with      duodenitis.  Given the negative imaging studies and GI evaluation,      it was felt that no further evaluation  was required.  She will      continue on proton pump inhibitor therapy for possible duodenitis      until seen in followup.  2. ID.  Patient's white count remained normal.  She had stool for C.      diff that was negative x2.  She did complete a course of      ciprofloxacin and Flagyl.  Her diarrhea did slowly improve.  At the      time of discharge, she is off the antibiotics, afebrile with no      diarrhea.  3. Chronic pain management.  Patient was resumed on her home regimen      of oxycodone 15 mg q.i.d.  She also required additional pain      medication with p.r.n. morphine.  She was also treated with IV      ketorolac for left ovarian cyst pain.  By the time of discharge,      ketorolac had been discontinued.  She had been requiring morphine      but at this point with no identifiable source of acute pain she      will just be continued on her self-reported routine oxycodone      regimen.  4. Hepatitis C.  Patient reports that she has a greater than 3,000,000      count viral load with positive hepatitis C.  She is interested in      pursuing treatment and agrees that she will full disclose all of      her other medical problems including depression to whoever she sees      for treatment.  It will be her decision as to whether she returns      to [facility] or goes to the [facility] in      [place].  5. Psychosocial.  Patient with a complex history.  She has a history      of IV drug use.  She does have a 62-year-old child but just recently      separated from her partner.  She is financially stressed.  This all      may be playing a role in regard to her pain syndrome.   DISCHARGE EXAMINATION:  Temperature was 98.1.  Blood pressure was 97/52.  Heart rate 75.  Respirations 18.  O2 sats 96% on room air.  GENERAL APPEARANCE:  This is a well-nourished young woman in no acute  distress.  CHEST:  Patient is moving air well with no rales, wheezes, or rhonchi.   CARDIOVASCULAR:  A 2+ radial pulse.  She had a quiet precordium with a  regular rate and rhythm.  ABDOMEN:  Patient had positive bowel sounds in all 4 quadrants.  Her  abdomen was soft.  She does have tenderness to palpation which is  diffuse.  PELVIC EXAM:  Deferred.  RECTAL EXAM:  Deferred.  EXTREMITIES: Without clubbing, cyanosis, or edema.  No deformities are  noted.  NEUROLOGIC EXAM:  Patient is awake, alert, oriented to person, place,  time, and context.  Understands her situation.  No further examination  conducted.   FINAL LABORATORY:  Final stool culture result was negative for  Salmonella, Shigella, Campylobacter, or Yersinia.  Stool for H. pylori  was negative.  Stool for ova and parasite was negative.  Stool for  Giardia and Cryptosporidium was negative.  Lipase, March 30, 2008,  was normal at 21, amylase was normal at 31.  Last liver function,  March 30, 2008, was normal with an SGOT of 35, SGPT of 34.  Final  CBC, March 30, 2008, was normal with a white count of 5100,  hemoglobin of 13.3 g.  Tissue transglutaminase for celiac disease,  March 28, 2008, was normal.  CA19-9 drawn due to history of ovarian  cyst but also family history of ovarian cancer was normal at 24.8.   DISCHARGE MEDICATIONS:  1. Klonopin 1 mg b.i.d.  2. Trazodone 100 mg q.h.s.  3. Omeprazole 20 mg 2 capsules q.a.m.  4. Lomotil q.6 p.r.n. diarrhea.  5. Oxycodone 15 mg q.i.d. with a 32-month supply provided.  6. Zofran 4 mg q.6 p.r.n.   DISPOSITION:  Patient is discharged home.  She will follow up with Dr.  Birdie Sons who she identifies as her primary care doctor.  She says  she has called the office and is establishing for an appointment.   Patient's condition at time of discharge dictation is medically stable  with ongoing pain with no indication for further evaluation.      Rosalyn Gess Norins, MD  Electronically Signed     MEN/MEDQ  D:  04/03/2008  T:  04/03/2008  Job:   [id number]   cc:   Valetta Mole. Swords, MD  [address] [place]  [place] [address]   Dr. Madilyn Fireman

## 2010-09-30 NOTE — Discharge Summary (Signed)
NAMEJOLAN, MEALOR        ACCOUNT NO.:  [id number]   MEDICAL RECORD NO.:  [id number]          PATIENT TYPE:  INP   LOCATION:  6703                         FACILITY:  [facility]   PHYSICIAN:  Jordan Hawks. Elnoria Howard, MD    DATE OF BIRTH:  10-Jun-1980   DATE OF ADMISSION:  06/07/2007  DATE OF DISCHARGE:                               DISCHARGE SUMMARY   DISCHARGE DIAGNOSES:  1. right-sided abdominal pain.  2. Leg pain, questionable fibromyalgia.  3. History of hepatitis C.  4. history of opioid dependence.   HISTORY AND PHYSICAL:  Please see the original history and physical for  full details.   HOSPITAL COURSE:  The patient was provided with IV hydration and routine  blood work was performed, which was negative for any significant  findings.  A psychiatry consult was obtained for the patient in regards  to her prior history of narcotic abuse.  The patient was not in any  overt signs and symptoms of withdrawal; however, I felt that there may  have been a psychological component with her current symptoms.  Psychiatry concurred that there was no physiologic withdrawal  manifestations at this time.  From a GI standpoint, I am uncertain about  the cause of this type of abdominal pain free and during her previous  hospitalization she was found to have a mild gastroparesis with  retention at 2 hours of 35%, which is rather mild and her current  clinical symptomatology is not consistent with significant  gastroparesis.  When she was admitted, the patient was noted to have  eaten a significant amount of her meal, although she states that she had  vomited the food up.  However, nursing did not report any evidence of  vomiting.  The patient was ordered Zofran for her brief hospitalization;  however, she was not reported to use any of that medication.   On the day of discharge, she was complaining of a significant amount of  leg pain.  It was recommended from the GI standpoint that she use a  nonsteroidal such as ibuprofen or Tylenol to help alleviate the pain  which could possibly be from fibromyalgia.  In regards to her abdominal  pain, I strongly recommend that we avoid all narcotics.  However, I will  leave the decision to her pain physician, Dr. Riley Kill, to better manage  this issue.  Once her baseline level of pain is controlled, then I will  reconsider performing further evaluation regarding the possible  treatment from a GI standpoint for the gastroparesis.  Again, I do not  feel that this is the true cause of her current situation.  I will  follow  psychiatry's recommendation in regards to providing her trazodone for  sleep and also I might do a tap for her possible abdominal pain.  The  patient was maintained on Ativan during her hospitalization, but I will  discontinue that at this time.      Jordan Hawks Elnoria Howard, MD  Electronically Signed     PDH/MEDQ  D:  06/09/2007  T:  06/09/2007  Job:  [id number]   cc:   Gloriajean Dell. Andrey Campanile, M.D.  Ranelle Oyster, M.D.

## 2010-09-30 NOTE — H&P (Signed)
[name], [name]        ACCOUNT NO.:  [id number]   MEDICAL RECORD NO.:  [id number]          PATIENT TYPE:  INP   LOCATION:  1311                         FACILITY:  [facility]   PHYSICIAN:  Gordy Savers, MDDATE OF BIRTH:  Jun 17, 1980   DATE OF ADMISSION:  03/23/2008  DATE OF DISCHARGE:                              HISTORY & PHYSICAL   CHIEF COMPLAINT:  Abdominal pain, nausea and vomiting.   HISTORY OF PRESENT ILLNESS:  The patient is a 30 year old female who was  initially evaluated at [facility] 3 days ago.  At that  time she was diagnosed with a UTI and colitis and was treated with  metronidazole and Ciprofloxacin.  She established with a [facility]  physician the following day and was maintained on antibiotic therapy.  Over the past few days she continues to have intermittent crampy  abdominal pain.  She describes pain in the right upper quadrant with  radiation to the right flank and also the left lower quadrant.  She has  had frequent episodes of nausea, vomiting, diarrhea which worsened  today.  She states today she has become progressively weak with  worsening pain and intractable nausea and vomiting with inability to  tolerate oral intake.  The patient denied any hematemesis or  hematochezia, denied any fever or chills.  The patient has a history of  chronic pain syndrome and has been on OxyContin.  ED evaluation earlier  included a urine pregnancy test that was negative.  CT scan revealed an  IUD in place.  CT of the abdomen and pelvis revealed a ring-enhancing  lesion of the left ovary consistent with a corpus luteum or hemorrhagic  cyst.  There is a small amount of free pelvic fluid and stranding along  the distal sigmoid colon consistent with mild colitis.  There is also  bladder wall thickening raising the possibility of cystitis.  There are  also a few reactive mesenteric nodes.  Due to worsening pain and  intractable nausea and vomiting the  patient presented to the emergency  department tonight.  Evaluation included vital signs that revealed a  normal temperature.  There is no tachycardia or tachypnea.  O2  saturation was 96% - 98%.  White count was normal at 7.4 with a normal  hemoglobin/hematocrit, electrolytes were normal.  BUN creatinine were 8  and 1 respectively.  Serum albumin was normal at 3.9 and liver function  studies were mildly abnormal with an SGOT of 42 and an SGPT of 49.  The  patient does have a history of chronic hepatitis C.  Urinalysis revealed  ketonuria but otherwise was unremarkable.  The patient is now admitted  for further evaluation and treatment of her abdominal pain and  intractable nausea and vomiting.   PRESENT MEDICAL REGIMEN:  1. Ciprofloxacin 750 b.i.d.  2. Metronidazole 500 mg q.i.d.  3. Trazodone 100 mg at bedtime.  4. Klonopin 1 mg b.i.d.  5. OxyContin 15 mg p.r.n.   The patient has been followed by Dr. Riley Kill of pain management.  THE  PATIENT HAS NO KNOWN ALLERGIES.   PAST MEDICAL HISTORY:  1. History  of chronic pain syndrome.  2. History of somatization disorder.  3. Anxiety/depression and has been diagnosed as bipolar.  4. Chronic hepatitis C.  5. History of migraines.  6. Remote history of alcohol and drug abuse.  7. History of asthma.  8. Recurrent UTIs.   PAST SURGICAL HISTORY:  Appendectomy and cholecystectomy.   FAMILY HISTORY:  Positive for hypertension, arthritis, lung cancer and  an unclear gynecologic cancer.   SOCIAL HISTORY:  The patient is unemployed, one child in the custody of  the father.  The patient is recently separated from her husband and has  no health insurance.  The patient has a history of ongoing tobacco use,  started in 1994.  She is a one-pack per day smoker.  There is no present  alcohol use.   EXAMINATION:  Revealed a well-developed, healthy-appearing female in no  acute distress.  SKIN:  Unremarkable without rash.  She did have a tattoo in  the left  lateral neck area as well as tongue piercing.  HEAD:  Revealed normal pupil responses, conjunctiva are clear,  anicteric.  ENT:  Normal.  Mucous membranes appear pink and well-hydrated.  NECK:  No adenopathy.  CHEST:  Clear.  CARDIOVASCULAR:  Revealed normal S1-S2, there is no tachycardia.  ABDOMEN:  Soft, there is a suggestion of mild tenderness in left lower  quadrant, no guarding, rebound, tenderness.  EXTREMITIES:  Revealed no  edema.  Peripheral pulses were intact.  NEURO:  Negative.   IMPRESSION:  1. Abdominal pain.  2. Intractable nausea and vomiting.  3. Recent history of urinary tract infection and colitis.   DISPOSITION:  We will admit the patient to the hospital and support with  IV fluids.  Will continue antiemetics and give her an antibiotic regimen  intravenously.   ADDITIONAL DIAGNOSES:  1. Bipolar depression.  2. Chronic pain syndrome.      Gordy Savers, MD  Electronically Signed     PFK/MEDQ  D:  03/23/2008  T:  03/24/2008  Job:  [id number]
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
## 2010-09-30 NOTE — Op Note (Signed)
[name], [name]        ACCOUNT NO.:  [id number]   MEDICAL RECORD NO.:  [id number]          PATIENT TYPE:  INP   LOCATION:                               FAC[facility]  [facility]   PHYSICIAN:  Shirley Friar, MDDATE OF BIRTH:  1981/03/25   DATE OF PROCEDURE:  DATE OF DISCHARGE:                               OPERATIVE REPORT   PROCEDURE PERFORMED:  Colonoscopy.   INDICATION:  Diarrhea, rectal bleeding, abdominal pain, nausea and  vomiting.   MEDICATIONS:  Fentanyl 175 mcg IV, Versed 14 mg IV, Phenergan 25 mg IV.   FINDINGS:  Rectal exam was normal.  A pediatric Pentax colonoscope was  inserted to a fair prepped colon and advanced to the cecum, where the  ileocecal valve and appendiceal orifice was identified.  On careful  withdrawal of the colonoscope, no mucosal abnormalities were seen.  There was a focal area of superficial bleeding due to scope trauma that  was seen in the right side of the colon.  Retroflexion in the rectum  revealed small internal hemorrhoids.  No evidence of colitis was seen.   ASSESSMENT:  1. Small internal hemorrhoids, likely source of rectal bleeding.  2. No endoscopic source of diarrhea noted - suspect secondary to      functional bowel.   PLAN:  Advance diet.      Shirley Friar, MD  Electronically Signed     VCS/MEDQ  D:  04/02/2008  T:  04/03/2008  Job:  [id number]

## 2010-10-03 NOTE — Discharge Summary (Signed)
[name]        ACCOUNT NO.:  [id number]   MEDICAL RECORD NO.:  [id number]          PATIENT TYPE:  IPS   LOCATION:  0602                          FACILITY:  [facility]   PHYSICIAN:  Syed T. Arfeen, M.D.   DATE OF BIRTH:  January 19, 1981   DATE OF ADMISSION:  11/16/2007  DATE OF DISCHARGE:  11/21/2007                               DISCHARGE SUMMARY   IDENTIFICATION INFORMATION:  The patient is a 30 year old female who is  from [facility] admitted reporting hallucinations.  The patient  was recently started on treatment for hepatitis C with interferon, and  then she reported taking interferon,  acting crazy.  The patient has  been seeing things and talking to the family members that are not there.  She reports worsening in the past few weeks and unable to do things for  her daily activities.  The patient was admitted on July 2.  She was seen  by Dr. Milford Cage.  At the time of admission the patient denies any  suicidal thoughts, homicidal thoughts, but complaining of visual and  auditory hallucinations and seen talking to herself.  She reported poor  sleep, increased anxiety but no agitation.   PAST PSYCHIATRIC HISTORY:  In the past the patient has been tried on  lithium, Seroquel which did help.   FAMILY HISTORY:  She reported mother is on Zyprexa.   MEDICAL HISTORY:  She sees Dr. Hermelinda Medicus.  She has a migraine, and she  has hepatitis C.   CURRENT MEDICATIONS:  1. Keppra 250 twice a day.  2. Trazodone 100 mg at bedtime.  3. She is also on Oxycodone as needed.   HOSPITAL COURSE:  The patient was admitted, and she was started on  Zyprexa 5 mg to control her hallucinations.  The Zyprexa dose was up to  10 mg, but she continues to see things and feels acting crazy.  The  patient was also encouraged to participate in group milieu therapy, but  she continued to endorse poor sleep, poor attention, and easily  distracted.  On July 4,  Zyprexa was stopped as it was not  helping, and  the patient was started on Seroquel.  The patient started showing some  improvement with Seroquel.  Her sleep got better and Seroquel was  adjusted to a little higher dose with a dose titration of Seroquel to 50  mg 3 times a day and 1 at bedtime.  She continued to improve.  She  reported less mood lability and increased concentration, increased  sleep.  On July 6 the patient reported that she is doing much better,  and she feels ready to go home.  Social worker spoke to the patient's  husband who also endorsed the same feeling that patient feels  better.  She was seen by nurse practitioner Lynann Bologna who also endorsed that  the patient is doing much better, but did recommend to take Seroquel up  to 150 at bedtime if needed.  She was recommended to attend IOP upon  discharge.   CONDITION AT DISCHARGE:  As per Lynann Bologna, nurse practitioner, the  patient is doing much better.  No reported side effects and feels safe  for discharge.  Reported no hallucinations either visual or auditory,  mood stable and in full reality of her thought content.  No auditory  hallucination.  No suicidal ideation.   DISCHARGE DIAGNOSIS:  AXIS I:  Psychosis not otherwise specified, rule  out medication-induced psychosis.   DISCHARGE MEDICATIONS:  1. Tylenol 100 mg at bedtime if needed for sleep,  2. Keppra 250 mg twice daily.  3. Oxycodone 5 mg 1-1/2 every 6th time as needed.  4. Seroquel 50 mg 3 at bedtime and 1 to 1-1/2 at bedtime.  5. She is also given Phenergan for p.r.n. nausea.   DISCHARGE FOLLOWUP:  The patient will attend the IOP for continuation of  her treatment.  She will also follow up with Dr. Hermelinda Medicus on July 14 for  her medical care.      Syed T. Lolly Mustache, M.D.  Electronically Signed     STA/MEDQ  D:  12/02/2007  T:  12/02/2007  Job:  [id number]

## 2010-10-03 NOTE — Op Note (Signed)
NAMESHEVY, YANEY        ACCOUNT NO.:  [id number]   MEDICAL RECORD NO.:  [id number]          PATIENT TYPE:  INP   LOCATION:  9168                          FACILITY:  [facility]   PHYSICIAN:  Lenoard Aden, M.D.DATE OF BIRTH:  1980/08/04   DATE OF PROCEDURE:  06/04/2006  DATE OF DISCHARGE:                               OPERATIVE REPORT   INDICATION FOR OPERATIVE DELIVERY:  Deep variable decelerations.   POSTOPERATIVE DIAGNOSIS:  Deep variable decelerations.   PROCEDURE:  Outlet vacuum-assisted vaginal delivery with a [employer]   SURGEON:  Lenoard Aden, MD.   ANESTHESIA:  Epidural by Malen Gauze.   ESTIMATED BLOOD LOSS:  500 ml.   COMPLICATIONS:  None.   DRAINS:  None.   COUNTS:  Correct.   The patient to recovery in good condition.   BRIEF OPERATIVE NOTE:  After being apprised of the risks of the Kiwi Cup  to include cephalohematoma, scalp laceration, and intracranial  hemorrhage, the Kiwi Cup was placed on the fetal vertex OA +4 station  for one pull over bilateral periurethral lacerations, a tight nuchal  cord x1 reduced on the perineum, Apgar's of a female baby 8 and 9. Cord  blood was collected.  The placenta delivered spontaneously intact, a  three-vessel cord noted.  Interrupted sutures of a #3-0 Vicryl repeat  are placed in the bilateral periurethral lacerations, no cervical  lacerations noted.  The patient tolerates the procedure well, and mother  and baby are recovering in good condition.      Lenoard Aden, M.D.  Electronically Signed     RJT/MEDQ  D:  06/04/2006  T:  06/04/2006  Job:  [id number]

## 2010-10-03 NOTE — H&P (Signed)
[name], [name]        ACCOUNT NO.:  [id number]   MEDICAL RECORD NO.:  [id number]          PATIENT TYPE:  INP   LOCATION:  9168                          FACILITY:  [facility]   PHYSICIAN:  Lenoard Aden, M.D.DATE OF BIRTH:  [birth year]   DATE OF ADMISSION:  06/04/2006  DATE OF DISCHARGE:                              HISTORY & PHYSICAL   CHIEF COMPLAINT:  Prodromal labor at 39 weeks with chronic hepatitis C  for augmentation and induction.   She is a 30 year old white female, G2 P0, EDD June 06, 2006, at 39+  weeks' gestation with a history of borderline SGA, chronic hepatitis C,  multiple visits for prodromal labor for augmentation and attempt at  delivery.  She has a history of chronic hepatitis C with stable LFTs.   NO KNOWN DRUG ALLERGIES.   Medications are prenatal vitamins.   History of 1 spontaneous abortion with D&C in 2000.   She is a nonsmoker, nondrinker.  She denies domestic or physical  violence.  She does have a history of multiple tattoos.   Prenatal course is complicated by chronic hepatitis C which has been  stable on repeat LFT measurement, history of borderline SGA, and history  of worsening low back pain with multiple visits for prodromal labor.   PHYSICAL EXAMINATION:  GENERAL:  She is a well-developed, well-nourished  white female in no acute distress.  HEENT:  Normal.  LUNGS:  Clear.  HEART:  Rate regular.  ABDOMEN:  Soft, gravid, nontender.  Estimated fetal weight 6-1/2 to 7  pounds.  PELVIC:  Cervix is 2 cm __________ vertex, -1.  EXTREMITIES:  Show no cords.  NEUROLOGICAL:  Exam is nonfocal.   IMPRESSION:  1. A 39-week intrauterine pregnancy.  2. Prodromal labor.  3. Chronic hepatitis C.  4. Borderline small for gestational age (SGA).   PLAN:  Proceed with induction.  Risks and benefits discussed.      Lenoard Aden, M.D.  Electronically Signed     RJT/MEDQ  D:  06/04/2006  T:  06/04/2006  Job:  [id number]

## 2010-10-07 ENCOUNTER — Emergency Department (HOSPITAL_COMMUNITY)
Admission: EM | Admit: 2010-10-07 | Discharge: 2010-10-08 | Disposition: A | Discharge status: Home / Self Care | Payer: Self-pay | Attending: Emergency Medicine | Admitting: Emergency Medicine

## 2010-10-07 DIAGNOSIS — G40909 Epilepsy, unspecified, not intractable, without status epilepticus: Secondary | ICD-10-CM | POA: Insufficient documentation

## 2010-10-07 DIAGNOSIS — Z79899 Other long term (current) drug therapy: Secondary | ICD-10-CM | POA: Insufficient documentation

## 2010-10-07 DIAGNOSIS — R197 Diarrhea, unspecified: Secondary | ICD-10-CM | POA: Insufficient documentation

## 2010-10-07 DIAGNOSIS — R109 Unspecified abdominal pain: Secondary | ICD-10-CM | POA: Insufficient documentation

## 2010-10-07 DIAGNOSIS — R000 Tachycardia, unspecified: Secondary | ICD-10-CM | POA: Insufficient documentation

## 2010-10-07 DIAGNOSIS — F319 Bipolar disorder, unspecified: Secondary | ICD-10-CM | POA: Insufficient documentation

## 2010-10-07 DIAGNOSIS — R112 Nausea with vomiting, unspecified: Secondary | ICD-10-CM | POA: Insufficient documentation

## 2010-10-07 LAB — CBC
Hemoglobin: 16 g/dL — ABNORMAL HIGH (ref 12.0–15.0)
MCHC: 36.4 g/dL — ABNORMAL HIGH (ref 30.0–36.0)
Platelets: 120 10*3/uL — ABNORMAL LOW (ref 150–400)
RBC: 5 MIL/uL (ref 3.87–5.11)

## 2010-10-07 LAB — POCT PREGNANCY, URINE: Preg Test, Ur: NEGATIVE

## 2010-10-07 LAB — DIFFERENTIAL
Basophils Absolute: 0 10*3/uL (ref 0.0–0.1)
Basophils Relative: 0 % (ref 0–1)
Eosinophils Absolute: 0.4 10*3/uL (ref 0.0–0.7)
Neutro Abs: 2.9 10*3/uL (ref 1.7–7.7)
Neutrophils Relative %: 41 % — ABNORMAL LOW (ref 43–77)

## 2010-10-07 LAB — URINALYSIS, ROUTINE W REFLEX MICROSCOPIC
Nitrite: NEGATIVE
Specific Gravity, Urine: 1.01 (ref 1.005–1.030)
Urobilinogen, UA: 0.2 mg/dL (ref 0.0–1.0)

## 2010-10-08 LAB — COMPREHENSIVE METABOLIC PANEL
ALT: 42 U/L — ABNORMAL HIGH (ref 0–35)
AST: 34 U/L (ref 0–37)
Albumin: 4.4 g/dL (ref 3.5–5.2)
CO2: 23 mEq/L (ref 19–32)
Calcium: 9.5 mg/dL (ref 8.4–10.5)
Chloride: 109 mEq/L (ref 96–112)
GFR calc Af Amer: >60 mL/min (ref >60)
GFR calc non Af Amer: >60 mL/min (ref >60)
Sodium: 146 mEq/L — ABNORMAL HIGH (ref 135–145)

## 2010-11-04 ENCOUNTER — Emergency Department (HOSPITAL_COMMUNITY)
Admission: EM | Admit: 2010-11-04 | Discharge: 2010-11-04 | Disposition: A | Discharge status: Home / Self Care | Payer: Self-pay | Attending: Emergency Medicine | Admitting: Emergency Medicine

## 2010-11-04 DIAGNOSIS — Z8619 Personal history of other infectious and parasitic diseases: Secondary | ICD-10-CM | POA: Insufficient documentation

## 2010-11-04 DIAGNOSIS — R000 Tachycardia, unspecified: Secondary | ICD-10-CM | POA: Insufficient documentation

## 2010-11-04 DIAGNOSIS — G40909 Epilepsy, unspecified, not intractable, without status epilepticus: Secondary | ICD-10-CM | POA: Insufficient documentation

## 2010-11-04 DIAGNOSIS — R1013 Epigastric pain: Secondary | ICD-10-CM | POA: Insufficient documentation

## 2010-11-04 LAB — COMPREHENSIVE METABOLIC PANEL
AST: 21 U/L (ref 0–37)
Albumin: 3.8 g/dL (ref 3.5–5.2)
Alkaline Phosphatase: 60 U/L (ref 39–117)
Chloride: 106 mEq/L (ref 96–112)
Potassium: 3.6 mEq/L (ref 3.5–5.1)
Total Bilirubin: 0.2 mg/dL — ABNORMAL LOW (ref 0.3–1.2)
Total Protein: 6.9 g/dL (ref 6.0–8.3)

## 2010-11-04 LAB — RAPID URINE DRUG SCREEN, HOSP PERFORMED
Amphetamines: POSITIVE — AB
Benzodiazepines: POSITIVE — AB
Cocaine: POSITIVE — AB
Opiates: NOT DETECTED

## 2010-11-04 LAB — DIFFERENTIAL
Basophils Relative: 0 % (ref 0–1)
Eosinophils Absolute: 0.1 10*3/uL (ref 0.0–0.7)
Lymphs Abs: 2.3 10*3/uL (ref 0.7–4.0)
Neutrophils Relative %: 65 % (ref 43–77)

## 2010-11-04 LAB — URINALYSIS, ROUTINE W REFLEX MICROSCOPIC
Bilirubin Urine: NEGATIVE
Glucose, UA: NEGATIVE mg/dL
Hgb urine dipstick: NEGATIVE
Ketones, ur: NEGATIVE mg/dL
Protein, ur: NEGATIVE mg/dL

## 2010-11-04 LAB — CBC
MCV: 91.1 fL (ref 78.0–100.0)
Platelets: 127 10*3/uL — ABNORMAL LOW (ref 150–400)
RBC: 4.72 MIL/uL (ref 3.87–5.11)
WBC: 8.9 10*3/uL (ref 4.0–10.5)

## 2010-11-04 LAB — URINE MICROSCOPIC-ADD ON

## 2010-11-04 LAB — POCT PREGNANCY, URINE: Preg Test, Ur: NEGATIVE

## 2010-11-04 LAB — ETHANOL: Alcohol, Ethyl (B): <11 mg/dL (ref 0–11)

## 2010-11-05 LAB — URINE CULTURE
Colony Count: 15000
Culture  Setup Time: 201206200119

## 2010-12-09 ENCOUNTER — Inpatient Hospital Stay (HOSPITAL_COMMUNITY)
Admission: EM | Admit: 2010-12-09 | Discharge: 2010-12-15 | DRG: 372 | Disposition: A | Discharge status: Home / Self Care | Payer: Self-pay | Attending: Internal Medicine | Admitting: Internal Medicine

## 2010-12-09 ENCOUNTER — Emergency Department (HOSPITAL_COMMUNITY): Payer: Self-pay

## 2010-12-09 ENCOUNTER — Encounter (HOSPITAL_COMMUNITY): Payer: Self-pay | Admitting: Radiology

## 2010-12-09 DIAGNOSIS — G8929 Other chronic pain: Secondary | ICD-10-CM | POA: Diagnosis present

## 2010-12-09 DIAGNOSIS — F341 Dysthymic disorder: Secondary | ICD-10-CM | POA: Diagnosis present

## 2010-12-09 DIAGNOSIS — A0472 Enterocolitis due to Clostridium difficile, not specified as recurrent: Principal | ICD-10-CM | POA: Diagnosis present

## 2010-12-09 DIAGNOSIS — R51 Headache: Secondary | ICD-10-CM | POA: Diagnosis present

## 2010-12-09 DIAGNOSIS — B1920 Unspecified viral hepatitis C without hepatic coma: Secondary | ICD-10-CM | POA: Diagnosis present

## 2010-12-09 DIAGNOSIS — G40909 Epilepsy, unspecified, not intractable, without status epilepticus: Secondary | ICD-10-CM | POA: Diagnosis present

## 2010-12-09 DIAGNOSIS — F17200 Nicotine dependence, unspecified, uncomplicated: Secondary | ICD-10-CM | POA: Diagnosis present

## 2010-12-09 DIAGNOSIS — D696 Thrombocytopenia, unspecified: Secondary | ICD-10-CM | POA: Diagnosis present

## 2010-12-09 DIAGNOSIS — R109 Unspecified abdominal pain: Secondary | ICD-10-CM | POA: Diagnosis present

## 2010-12-09 DIAGNOSIS — F1120 Opioid dependence, uncomplicated: Secondary | ICD-10-CM | POA: Diagnosis present

## 2010-12-09 DIAGNOSIS — J45909 Unspecified asthma, uncomplicated: Secondary | ICD-10-CM | POA: Diagnosis present

## 2010-12-09 DIAGNOSIS — K861 Other chronic pancreatitis: Secondary | ICD-10-CM | POA: Diagnosis present

## 2010-12-09 DIAGNOSIS — F319 Bipolar disorder, unspecified: Secondary | ICD-10-CM | POA: Diagnosis present

## 2010-12-09 LAB — CBC
Hemoglobin: 16.9 g/dL — ABNORMAL HIGH (ref 12.0–15.0)
MCH: 31.8 pg (ref 26.0–34.0)
MCHC: 36.3 g/dL — ABNORMAL HIGH (ref 30.0–36.0)
Platelets: 145 10*3/uL — ABNORMAL LOW (ref 150–400)
RDW: 12.7 % (ref 11.5–15.5)

## 2010-12-09 LAB — COMPREHENSIVE METABOLIC PANEL
ALT: 31 U/L (ref 0–35)
AST: 28 U/L (ref 0–37)
Albumin: 4.5 g/dL (ref 3.5–5.2)
Alkaline Phosphatase: 68 U/L (ref 39–117)
Calcium: 10 mg/dL (ref 8.4–10.5)
Glucose, Bld: 98 mg/dL (ref 70–99)
Potassium: 4.4 mEq/L (ref 3.5–5.1)
Sodium: 137 mEq/L (ref 135–145)
Total Protein: 8.2 g/dL (ref 6.0–8.3)

## 2010-12-09 LAB — DIFFERENTIAL
Basophils Absolute: 0.1 10*3/uL (ref 0.0–0.1)
Basophils Relative: 1 % (ref 0–1)
Eosinophils Absolute: 0.3 10*3/uL (ref 0.0–0.7)
Monocytes Absolute: 0.6 10*3/uL (ref 0.1–1.0)
Neutro Abs: 5.7 10*3/uL (ref 1.7–7.7)
Neutrophils Relative %: 62 % (ref 43–77)

## 2010-12-09 LAB — URINALYSIS, ROUTINE W REFLEX MICROSCOPIC
Bilirubin Urine: NEGATIVE
Glucose, UA: NEGATIVE mg/dL
Hgb urine dipstick: NEGATIVE
Specific Gravity, Urine: 1.023 (ref 1.005–1.030)
Urobilinogen, UA: 1 mg/dL (ref 0.0–1.0)
pH: 6.5 (ref 5.0–8.0)

## 2010-12-09 LAB — PREGNANCY, URINE: Preg Test, Ur: NEGATIVE

## 2010-12-09 MED ORDER — IOHEXOL 300 MG/ML  SOLN
80.0000 mL | Freq: Once | INTRAMUSCULAR | Status: AC | PRN
  Administered 2010-12-09: 80 mL via INTRAVENOUS

## 2010-12-10 DIAGNOSIS — R933 Abnormal findings on diagnostic imaging of other parts of digestive tract: Secondary | ICD-10-CM

## 2010-12-10 DIAGNOSIS — R197 Diarrhea, unspecified: Secondary | ICD-10-CM

## 2010-12-10 DIAGNOSIS — R1084 Generalized abdominal pain: Secondary | ICD-10-CM

## 2010-12-10 LAB — CBC
HCT: 39.8 % (ref 36.0–46.0)
MCV: 87.7 fL (ref 78.0–100.0)
Platelets: 134 10*3/uL — ABNORMAL LOW (ref 150–400)
RBC: 4.54 MIL/uL (ref 3.87–5.11)
RDW: 12.5 % (ref 11.5–15.5)
WBC: 5.4 10*3/uL (ref 4.0–10.5)

## 2010-12-10 LAB — COMPREHENSIVE METABOLIC PANEL
AST: 18 U/L (ref 0–37)
Albumin: 3.7 g/dL (ref 3.5–5.2)
Alkaline Phosphatase: 59 U/L (ref 39–117)
BUN: 8 mg/dL (ref 6–23)
CO2: 22 mEq/L (ref 19–32)
Chloride: 104 mEq/L (ref 96–112)
Potassium: 4.2 mEq/L (ref 3.5–5.1)
Total Bilirubin: 0.3 mg/dL (ref 0.3–1.2)

## 2010-12-10 LAB — MRSA PCR SCREENING: MRSA by PCR: NEGATIVE

## 2010-12-10 LAB — DIFFERENTIAL
Basophils Absolute: 0 10*3/uL (ref 0.0–0.1)
Eosinophils Relative: 0 % (ref 0–5)
Lymphocytes Relative: 16 % (ref 12–46)
Lymphs Abs: 0.9 10*3/uL (ref 0.7–4.0)
Neutrophils Relative %: 82 % — ABNORMAL HIGH (ref 43–77)

## 2010-12-10 LAB — MAGNESIUM: Magnesium: 2.1 mg/dL (ref 1.5–2.5)

## 2010-12-10 LAB — PHOSPHORUS: Phosphorus: 2.8 mg/dL (ref 2.3–4.6)

## 2010-12-11 DIAGNOSIS — R197 Diarrhea, unspecified: Secondary | ICD-10-CM

## 2010-12-11 DIAGNOSIS — R933 Abnormal findings on diagnostic imaging of other parts of digestive tract: Secondary | ICD-10-CM

## 2010-12-11 DIAGNOSIS — R1084 Generalized abdominal pain: Secondary | ICD-10-CM

## 2010-12-11 LAB — GLUCOSE, CAPILLARY: Glucose-Capillary: 96 mg/dL (ref 70–99)

## 2010-12-11 LAB — BASIC METABOLIC PANEL
Calcium: 8.5 mg/dL (ref 8.4–10.5)
Chloride: 112 mEq/L (ref 96–112)
Creatinine, Ser: 0.58 mg/dL (ref 0.50–1.10)
GFR calc Af Amer: >60 mL/min (ref >60)
GFR calc non Af Amer: >60 mL/min (ref >60)

## 2010-12-11 LAB — CLOSTRIDIUM DIFFICILE BY PCR: Toxigenic C. Difficile by PCR: POSITIVE — AB

## 2010-12-12 DIAGNOSIS — A0472 Enterocolitis due to Clostridium difficile, not specified as recurrent: Secondary | ICD-10-CM

## 2010-12-12 LAB — DIFFERENTIAL
Basophils Absolute: 0 10*3/uL (ref 0.0–0.1)
Basophils Relative: 0 % (ref 0–1)
Eosinophils Absolute: 0.4 10*3/uL (ref 0.0–0.7)
Eosinophils Relative: 6 % — ABNORMAL HIGH (ref 0–5)

## 2010-12-12 LAB — CBC
MCHC: 35.3 g/dL (ref 30.0–36.0)
Platelets: 136 10*3/uL — ABNORMAL LOW (ref 150–400)
RDW: 12.7 % (ref 11.5–15.5)
WBC: 6.8 10*3/uL (ref 4.0–10.5)

## 2010-12-12 LAB — BASIC METABOLIC PANEL
Chloride: 109 mEq/L (ref 96–112)
GFR calc Af Amer: >60 mL/min (ref >60)
GFR calc non Af Amer: >60 mL/min (ref >60)
Potassium: 3.8 mEq/L (ref 3.5–5.1)

## 2010-12-13 ENCOUNTER — Inpatient Hospital Stay (HOSPITAL_COMMUNITY): Payer: Self-pay

## 2010-12-13 LAB — CBC
HCT: 42.7 % (ref 36.0–46.0)
Hemoglobin: 14.7 g/dL (ref 12.0–15.0)
MCHC: 34.4 g/dL (ref 30.0–36.0)
MCV: 89.1 fL (ref 78.0–100.0)
RDW: 12.7 % (ref 11.5–15.5)
WBC: 7.7 10*3/uL (ref 4.0–10.5)

## 2010-12-13 LAB — BASIC METABOLIC PANEL
BUN: 7 mg/dL (ref 6–23)
Chloride: 108 mEq/L (ref 96–112)
Creatinine, Ser: 0.63 mg/dL (ref 0.50–1.10)
GFR calc Af Amer: >60 mL/min (ref >60)
Glucose, Bld: 97 mg/dL (ref 70–99)

## 2010-12-15 LAB — STOOL CULTURE

## 2010-12-15 LAB — BASIC METABOLIC PANEL
BUN: 12 mg/dL (ref 6–23)
Calcium: 9.2 mg/dL (ref 8.4–10.5)
Creatinine, Ser: 0.74 mg/dL (ref 0.50–1.10)
GFR calc Af Amer: >60 mL/min (ref >60)
GFR calc non Af Amer: >60 mL/min (ref >60)

## 2010-12-15 LAB — DIFFERENTIAL
Eosinophils Absolute: 0.7 10*3/uL (ref 0.0–0.7)
Eosinophils Relative: 9 % — ABNORMAL HIGH (ref 0–5)
Lymphocytes Relative: 43 % (ref 12–46)
Lymphs Abs: 3.1 10*3/uL (ref 0.7–4.0)
Monocytes Absolute: 0.7 10*3/uL (ref 0.1–1.0)
Monocytes Relative: 10 % (ref 3–12)

## 2010-12-15 LAB — CBC
HCT: 41.1 % (ref 36.0–46.0)
MCH: 31.1 pg (ref 26.0–34.0)
MCHC: 34.5 g/dL (ref 30.0–36.0)
MCV: 90.1 fL (ref 78.0–100.0)
Platelets: 143 10*3/uL — ABNORMAL LOW (ref 150–400)
RDW: 12.7 % (ref 11.5–15.5)
WBC: 7.2 10*3/uL (ref 4.0–10.5)

## 2010-12-16 LAB — CULTURE, BLOOD (ROUTINE X 2)
Culture  Setup Time: 201207250847
Culture: NO GROWTH

## 2010-12-21 ENCOUNTER — Emergency Department (HOSPITAL_COMMUNITY): Payer: Self-pay

## 2010-12-21 ENCOUNTER — Emergency Department (HOSPITAL_COMMUNITY)
Admission: EM | Admit: 2010-12-21 | Discharge: 2010-12-21 | Disposition: A | Discharge status: Home / Self Care | Payer: Self-pay | Attending: Emergency Medicine | Admitting: Emergency Medicine

## 2010-12-21 DIAGNOSIS — R51 Headache: Secondary | ICD-10-CM | POA: Insufficient documentation

## 2010-12-21 DIAGNOSIS — R10819 Abdominal tenderness, unspecified site: Secondary | ICD-10-CM | POA: Insufficient documentation

## 2010-12-21 DIAGNOSIS — F411 Generalized anxiety disorder: Secondary | ICD-10-CM | POA: Insufficient documentation

## 2010-12-21 DIAGNOSIS — Z79899 Other long term (current) drug therapy: Secondary | ICD-10-CM | POA: Insufficient documentation

## 2010-12-21 DIAGNOSIS — R110 Nausea: Secondary | ICD-10-CM | POA: Insufficient documentation

## 2010-12-21 DIAGNOSIS — R55 Syncope and collapse: Secondary | ICD-10-CM | POA: Insufficient documentation

## 2010-12-21 DIAGNOSIS — R109 Unspecified abdominal pain: Secondary | ICD-10-CM | POA: Insufficient documentation

## 2010-12-21 DIAGNOSIS — R630 Anorexia: Secondary | ICD-10-CM | POA: Insufficient documentation

## 2010-12-21 DIAGNOSIS — G40909 Epilepsy, unspecified, not intractable, without status epilepticus: Secondary | ICD-10-CM | POA: Insufficient documentation

## 2010-12-21 DIAGNOSIS — F319 Bipolar disorder, unspecified: Secondary | ICD-10-CM | POA: Insufficient documentation

## 2010-12-21 DIAGNOSIS — K5090 Crohn's disease, unspecified, without complications: Secondary | ICD-10-CM | POA: Insufficient documentation

## 2010-12-21 LAB — COMPREHENSIVE METABOLIC PANEL
Albumin: 4.4 g/dL (ref 3.5–5.2)
BUN: 8 mg/dL (ref 6–23)
Calcium: 9.2 mg/dL (ref 8.4–10.5)
GFR calc Af Amer: >60 mL/min (ref >60)
Glucose, Bld: 91 mg/dL (ref 70–99)
Sodium: 140 mEq/L (ref 135–145)
Total Protein: 7.5 g/dL (ref 6.0–8.3)

## 2010-12-21 LAB — URINALYSIS, ROUTINE W REFLEX MICROSCOPIC
Bilirubin Urine: NEGATIVE
Hgb urine dipstick: NEGATIVE
Ketones, ur: NEGATIVE mg/dL
Nitrite: NEGATIVE
Specific Gravity, Urine: 1.019 (ref 1.005–1.030)
Urobilinogen, UA: 0.2 mg/dL (ref 0.0–1.0)

## 2010-12-21 LAB — CBC
HCT: 43.8 % (ref 36.0–46.0)
Hemoglobin: 15.8 g/dL — ABNORMAL HIGH (ref 12.0–15.0)
MCH: 32.4 pg (ref 26.0–34.0)
MCHC: 36.1 g/dL — ABNORMAL HIGH (ref 30.0–36.0)
RDW: 12.9 % (ref 11.5–15.5)

## 2010-12-21 LAB — DIFFERENTIAL
Basophils Absolute: 0 10*3/uL (ref 0.0–0.1)
Basophils Relative: 0 % (ref 0–1)
Eosinophils Relative: 4 % (ref 0–5)
Monocytes Absolute: 0.6 10*3/uL (ref 0.1–1.0)
Monocytes Relative: 7 % (ref 3–12)
Neutro Abs: 4.8 10*3/uL (ref 1.7–7.7)

## 2010-12-21 LAB — POCT PREGNANCY, URINE: Preg Test, Ur: NEGATIVE

## 2010-12-21 LAB — LIPASE, BLOOD: Lipase: 48 U/L (ref 11–59)

## 2010-12-29 ENCOUNTER — Encounter (HOSPITAL_BASED_OUTPATIENT_CLINIC_OR_DEPARTMENT_OTHER): Payer: Self-pay | Admitting: *Deleted

## 2010-12-29 ENCOUNTER — Emergency Department (HOSPITAL_BASED_OUTPATIENT_CLINIC_OR_DEPARTMENT_OTHER)
Admission: EM | Admit: 2010-12-29 | Discharge: 2010-12-30 | Disposition: A | Discharge status: Other Acute Inpatient Hospital | Payer: Self-pay | Source: Home / Self Care | Attending: Emergency Medicine | Admitting: Emergency Medicine

## 2010-12-29 DIAGNOSIS — K529 Noninfective gastroenteritis and colitis, unspecified: Secondary | ICD-10-CM

## 2010-12-29 HISTORY — DX: Noninfective gastroenteritis and colitis, unspecified: K52.9

## 2010-12-29 HISTORY — DX: Unspecified viral hepatitis C without hepatic coma: B19.20

## 2010-12-29 HISTORY — DX: Acute pancreatitis without necrosis or infection, unspecified: K85.90

## 2010-12-29 LAB — DIFFERENTIAL
Basophils Absolute: 0 10*3/uL (ref 0.0–0.1)
Eosinophils Absolute: 0.2 10*3/uL (ref 0.0–0.7)
Eosinophils Relative: 3 % (ref 0–5)
Lymphocytes Relative: 38 % (ref 12–46)
Lymphs Abs: 2.5 10*3/uL (ref 0.7–4.0)
Monocytes Absolute: 0.4 10*3/uL (ref 0.1–1.0)

## 2010-12-29 LAB — CBC
HCT: 44.6 % (ref 36.0–46.0)
MCH: 31.5 pg (ref 26.0–34.0)
MCV: 87.8 fL (ref 78.0–100.0)
Platelets: 144 10*3/uL — ABNORMAL LOW (ref 150–400)
RDW: 13.2 % (ref 11.5–15.5)
WBC: 6.7 10*3/uL (ref 4.0–10.5)

## 2010-12-29 LAB — PREGNANCY, URINE: Preg Test, Ur: NEGATIVE

## 2010-12-29 LAB — URINALYSIS, ROUTINE W REFLEX MICROSCOPIC
Bilirubin Urine: NEGATIVE
Glucose, UA: NEGATIVE mg/dL
Hgb urine dipstick: NEGATIVE
Protein, ur: NEGATIVE mg/dL
Urobilinogen, UA: 1 mg/dL (ref 0.0–1.0)

## 2010-12-29 NOTE — ED Notes (Signed)
Pt reports she has "pancreatitis pains all the time, but it got worse over the past few days". Pt usually takes dilaudid or perc/vicodin, but is currently out since yesterday. Pt states her next appt is with "a new doctor" at [facility] on the 21st of this month.

## 2010-12-29 NOTE — ED Notes (Signed)
Per EMS: pt c/o "pancreatitis pains" x3 days. Pt is currently out of meds.

## 2010-12-30 ENCOUNTER — Emergency Department (INDEPENDENT_AMBULATORY_CARE_PROVIDER_SITE_OTHER): Payer: Self-pay

## 2010-12-30 ENCOUNTER — Inpatient Hospital Stay (HOSPITAL_COMMUNITY)
Admission: RE | Admit: 2010-12-30 | Discharge: 2011-01-07 | DRG: 392 | Disposition: A | Discharge status: Home / Self Care | Payer: Self-pay | Source: Other Acute Inpatient Hospital | Attending: Internal Medicine | Admitting: Internal Medicine

## 2010-12-30 DIAGNOSIS — J45909 Unspecified asthma, uncomplicated: Secondary | ICD-10-CM | POA: Diagnosis present

## 2010-12-30 DIAGNOSIS — E876 Hypokalemia: Secondary | ICD-10-CM | POA: Diagnosis not present

## 2010-12-30 DIAGNOSIS — G43909 Migraine, unspecified, not intractable, without status migrainosus: Secondary | ICD-10-CM | POA: Diagnosis present

## 2010-12-30 DIAGNOSIS — R109 Unspecified abdominal pain: Secondary | ICD-10-CM

## 2010-12-30 DIAGNOSIS — F1120 Opioid dependence, uncomplicated: Secondary | ICD-10-CM | POA: Diagnosis present

## 2010-12-30 DIAGNOSIS — K5289 Other specified noninfective gastroenteritis and colitis: Secondary | ICD-10-CM

## 2010-12-30 DIAGNOSIS — F411 Generalized anxiety disorder: Secondary | ICD-10-CM | POA: Diagnosis present

## 2010-12-30 DIAGNOSIS — F17200 Nicotine dependence, unspecified, uncomplicated: Secondary | ICD-10-CM | POA: Diagnosis present

## 2010-12-30 DIAGNOSIS — K861 Other chronic pancreatitis: Secondary | ICD-10-CM | POA: Diagnosis present

## 2010-12-30 DIAGNOSIS — Z8619 Personal history of other infectious and parasitic diseases: Secondary | ICD-10-CM

## 2010-12-30 DIAGNOSIS — N83209 Unspecified ovarian cyst, unspecified side: Secondary | ICD-10-CM

## 2010-12-30 DIAGNOSIS — B1920 Unspecified viral hepatitis C without hepatic coma: Secondary | ICD-10-CM | POA: Diagnosis present

## 2010-12-30 DIAGNOSIS — F319 Bipolar disorder, unspecified: Secondary | ICD-10-CM | POA: Diagnosis present

## 2010-12-30 DIAGNOSIS — A0472 Enterocolitis due to Clostridium difficile, not specified as recurrent: Secondary | ICD-10-CM | POA: Diagnosis present

## 2010-12-30 DIAGNOSIS — G8929 Other chronic pain: Secondary | ICD-10-CM | POA: Diagnosis present

## 2010-12-30 DIAGNOSIS — Z8719 Personal history of other diseases of the digestive system: Secondary | ICD-10-CM

## 2010-12-30 LAB — COMPREHENSIVE METABOLIC PANEL
CO2: 24 mEq/L (ref 19–32)
Calcium: 10.1 mg/dL (ref 8.4–10.5)
Creatinine, Ser: 0.5 mg/dL (ref 0.50–1.10)
GFR calc Af Amer: >60 mL/min (ref >60)
GFR calc non Af Amer: >60 mL/min (ref >60)
Glucose, Bld: 92 mg/dL (ref 70–99)
Sodium: 136 mEq/L (ref 135–145)
Total Protein: 8.1 g/dL (ref 6.0–8.3)

## 2010-12-30 MED ORDER — ONDANSETRON HCL 4 MG/2ML IJ SOLN
4.0000 mg | Freq: Once | INTRAMUSCULAR | Status: AC
  Administered 2010-12-30: 4 mg via INTRAVENOUS
  Filled 2010-12-30: qty 2

## 2010-12-30 MED ORDER — HYDROMORPHONE HCL 1 MG/ML IJ SOLN
1.0000 mg | Freq: Once | INTRAMUSCULAR | Status: AC
  Administered 2010-12-30: 1 mg via INTRAVENOUS
  Filled 2010-12-30: qty 1

## 2010-12-30 MED ORDER — METHYLPREDNISOLONE SODIUM SUCC 40 MG IJ SOLR
40.0000 mg | Freq: Once | INTRAMUSCULAR | Status: AC
  Administered 2010-12-30: 40 mg via INTRAVENOUS
  Filled 2010-12-30: qty 1

## 2010-12-30 MED ORDER — IOHEXOL 300 MG/ML  SOLN
100.0000 mL | Freq: Once | INTRAMUSCULAR | Status: AC | PRN
  Administered 2010-12-30: 100 mL via INTRAVENOUS

## 2010-12-30 MED ORDER — SODIUM CHLORIDE 0.9 % IV BOLUS (SEPSIS)
1000.0000 mL | Freq: Once | INTRAVENOUS | Status: AC
  Administered 2010-12-30: 1000 mL via INTRAVENOUS

## 2010-12-30 NOTE — H&P (Signed)
NAMEBRENEE, [name] NO.:  [id number]  MEDICAL RECORD NO.:  [id number]  LOCATION:  5001                         FACILITY:  [facility]  PHYSICIAN:  Lonia Blood, M.D.       DATE OF BIRTH:  1981-05-06  DATE OF ADMISSION:  12/30/2010 DATE OF DISCHARGE:                             HISTORY & PHYSICAL   PRIMARY CARE PHYSICIAN:  With the residency program at [facility].  CHIEF COMPLAINT:  Abdominal pain.  HISTORY OF PRESENT ILLNESS:  Mr. Leamy is a 30 year old woman with history of chronic abdominal pain of unclear etiology, opiate dependency who also has chronic hepatitis C presents to the emergency room today with complaints of acute on chronic abdominal pain.  She was recently admitted for 1 week at [facility] and treated for possible Clostridium difficile colitis.  She did say that her symptoms improved prior to discharge.  She presented back to the emergency room on December 21, 2010 where she had evaluation with repeat C. diff PCR which was negative.  At that time she did not have a CT scan of abdomen and pelvis.  She presented again last night, December 29, 2010 with complaints of more sharper abdominal pain in the epigastric area in the left upper quadrant radiation to the back.  She was also reporting constant diarrhea.  A CT scan done in the emergency room was read as proximal small bowel dilatation compatible with enteritis with dilated descending duodenum with air fluid levels present in the small bowel. It is interesting to note that this patient is status post appendectomy and cholecystectomy at such young age.  PAST MEDICAL HISTORY:  Chronic abdominal pain, chronic hepatitis C, asthma, migraine headaches, anxiety disorder, bipolar disorder, reported history of pancreatitis, reported history of Crohn disease but when asked the patient, she could not tell me that she ever had a biopsy to confirm this  diagnosis.  HOME MEDICATIONS:  Neurontin, Zoloft, trazodone, Klonopin, Dilaudid, Phenergan.  SOCIAL HISTORY:  She is separated and lives with her child in [place].  She smokes 1 pack cigarettes a day.  Denies drinking alcohol use and illicit drugs.  FAMILY HISTORY:  Both her parents are still alive with extensive chronic pain problems.  REVIEW OF SYSTEMS:  As per HPI, otherwise positive for occasional headaches.  No chest pain or shortness of breath.  Depressed mood.  No suicidal or homicidal ideations.  Otherwise per HPI are negative.  PHYSICAL EXAMINATION:  VITAL SIGNS:  Temperature 98.4, pulse 75, respiration 20, blood pressure 130/72, saturation 96% on room air. GENERAL:  The patient is currently calm and in no acute distress. HEAD:  Normocephalic, atraumatic. SKIN:  Pale. THROAT:  Clear. NECK:  Supple.  No JVD. CHEST:  Clear to auscultation.  No wheezes, rhonchi, or crackles. HEART:  Regular rate and rhythm without murmurs, rubs, or gallops. ABDOMEN:  Soft.  The patient has more tenderness.  She has rebound tenderness.  No guarding, localized in the epigastric area.  Bowel sounds are present. HEART:  Regular rate and rhythm without murmurs, rubs, or gallops. CHEST:  Clear to auscultation without wheezes, rhonchi, or crackles. LOWER EXTREMITIES:  Without edema. SKIN:  Covered in tattoos.  LABORATORY VALUES ON ADMISSION:  White blood cell count is 6000, hemoglobin 16, platelet count is 144,000.  Sodium is 136, potassium 4, chloride 99, bicarbonate 24, BUN 10 creatine 0.5, AST 38, ALT 64, albumin 4.7, total protein is 8.1.  Pregnancy test is negative.  CT scan of abdomen and pelvis reports presence of a 3.3 cm abnormal proximal small bowel dilatation compatible with enteritis.  ASSESSMENT/PLAN:  A 30 year old woman presenting with acute on chronic abdominal pain with likely etiology.  She is also status post multiple abdominal surgeries.  Etiology could be infectious  versus ischemic related to some adhesions versus inflammatory.  Plan is to place the patient on treatment with ciprofloxacin and Flagyl.  Obtain gastroenterology surgical consultation.  The patient has chronic hepatitis C, but I doubt that is the etiology of her current problems. She is also depressed but without any suicidal ideations.  We will continue trazodone and sertraline.  I have counseled her to stop smoking and start nicotine patch.     Lonia Blood, M.D.     SL/MEDQ  D:  12/30/2010  T:  12/30/2010  Job:  [id number]  Electronically Signed by Lonia Blood M.D. on 12/30/2010 05:50:44 PM

## 2010-12-30 NOTE — ED Notes (Signed)
IV was charted "removed" for documentation purposes only. IV does infact remain intact at time of transfer.

## 2010-12-30 NOTE — ED Notes (Signed)
Pt aware of plans for CT. Verbalized understanding.

## 2010-12-30 NOTE — ED Notes (Signed)
Dr. Webb at bedside

## 2010-12-30 NOTE — ED Provider Notes (Signed)
History     CSN: [id number] Arrival date & time: 12/29/2010 10:33 PM  Chief Complaint  Patient presents with  . Abdominal Pain   HPI Comments: 30yoF h/o chronic pancreatitis, hep C, ?colitis pw abdominal pain. Pt states this is similar to her chronic pain from colitis, crampy and sharp. States that she was diagnosed previously by one GI specialist with Crohn's but that she no longer caries the diagnosis. C/O pain x 3 days. +nausea and NBNB emesis, unable to tolerate PO at home. Denies back pain. Denies UTI sx. Denies vaginal discharge. No fever/chills/cp/sob. +loose stool, no blood in stool. Denies constipation. States she hasn't been taking steroids for "a long time". Followed by GI at [facility], states she was admitted last month for pain control. Takes dilaudid and norco at home for pain control  Patient is a 30 y.o. female presenting with abdominal pain.  Abdominal Pain The primary symptoms of the illness include abdominal pain.    Past Medical History  Diagnosis Date  . Asthma   . Pancreatitis   . Colitis   . Hepatitis C     Past Surgical History  Procedure Date  . Cholecystectomy   . Appendectomy   . Leg surgery   . Nasal sugery     No family history on file.  History  Substance Use Topics  . Smoking status: Current Everyday Smoker  . Smokeless tobacco: Not on file  . Alcohol Use: No    OB History    Grav Para Term Preterm Abortions TAB SAB Ect Mult Living                  Review of Systems  Gastrointestinal: Positive for abdominal pain.  All other systems reviewed and are negative.  except as noted HPI   Physical Exam  BP 100/69  Pulse 80  Temp(Src) 99 F (37.2 C) (Oral)  Resp 20  SpO2 99%  LMP 12/20/2010  Physical Exam  Nursing note and vitals reviewed. Constitutional: She is oriented to person, place, and time. She appears well-developed.       Appears uncomfortable  HENT:  Head: Atraumatic.  Mouth/Throat: Oropharynx is clear and moist.    Eyes: Conjunctivae and EOM are normal. Pupils are equal, round, and reactive to light.  Neck: Normal range of motion. Neck supple.  Cardiovascular: Normal rate, regular rhythm, normal heart sounds and intact distal pulses.   Pulmonary/Chest: Effort normal and breath sounds normal. No respiratory distress. She has no wheezes. She has no rales.  Abdominal: Soft. She exhibits no distension and no mass. There is tenderness. There is no rebound and no guarding.       Mild diffuse abdominal ttp  > epigastric and LUQ  Musculoskeletal: Normal range of motion.  Neurological: She is alert and oriented to person, place, and time.  Skin: Skin is warm and dry. No rash noted.  Psychiatric: She has a normal mood and affect.    ED Course  Procedures  MDM 26yoF h/o vague colitis diagnosis, chronic pancreatitis here with acute exacerbation of her chronic pain. Labs reviewed. CT abd/pelvis reviewed- +proximal ileitis, difficult to control pain here, suspect that is partially because of her chronic use of narcotics. Admit for pain control, steroids, further evaluation and management  3:08 AM  D/W Dr. Ronney Asters, GI-- admit to medicine. Recommends solumedrol 40mg  IV tid  3:38 AM  D/W Dr. Toniann Fail-- admit to [facility] team 6  4:11 AM  Pt continues to f/u abdominal pain. Will order dilaudid (  4th 1mg  dose)  Forbes Cellar, MD 12/30/10 [id number]

## 2010-12-30 NOTE — ED Notes (Signed)
MD at bedside. DR Hyman Hopes at bedside.

## 2010-12-30 NOTE — ED Notes (Signed)
Report given to [facility] on 5001

## 2010-12-31 LAB — BASIC METABOLIC PANEL
CO2: 28 mEq/L (ref 19–32)
Chloride: 106 mEq/L (ref 96–112)
GFR calc Af Amer: >60 mL/min (ref >60)
Potassium: 3.2 mEq/L — ABNORMAL LOW (ref 3.5–5.1)
Sodium: 140 mEq/L (ref 135–145)

## 2010-12-31 LAB — CBC
Platelets: 120 10*3/uL — ABNORMAL LOW (ref 150–400)
RBC: 4.16 MIL/uL (ref 3.87–5.11)
RDW: 13.3 % (ref 11.5–15.5)
WBC: 5.8 10*3/uL (ref 4.0–10.5)

## 2010-12-31 LAB — CLOSTRIDIUM DIFFICILE BY PCR: Toxigenic C. Difficile by PCR: POSITIVE — AB

## 2010-12-31 NOTE — Discharge Summary (Signed)
NAMECRISTAN, [name] NO.:  [id number]  MEDICAL RECORD NO.:  [id number]  LOCATION:  3001                         FACILITY:  [facility]  PHYSICIAN:  Marcellus Scott, MD     DATE OF BIRTH:  1980-12-01  DATE OF ADMISSION:  12/09/2010 DATE OF DISCHARGE:  12/15/2010                              DISCHARGE SUMMARY   PRIMARY CARE PHYSICIANS AND GI MD:  At [facility].  The patient does not recollect their names.  She has an appointment to see one of them on January 06, 2011.  DISCHARGE DIAGNOSES: 1. Clostridium difficile colitis. 2. Chronic abdominal pain. 3. Opioid dependency, opioid abuse. 4. Thrombocytopenia. 5. Noncompliance with medications and MD visits. 6. History of hepatitis C. 7. History of seizure disorder. 8. No clear evidence of inflammatory bowel disease or pancreatitis. 9. History of bipolar disorder. 10.History of polysubstance abuse.  DISCHARGE MEDICATIONS: 1. Metronidazole 500 mg p.o. t.i.d. 2. Florastor 500 mg p.o. b.i.d. 3. Dilaudid 4 mg p.o. q.6 h. p.r.n. pain, 20 tablets prescribed. 4. Klonopin 2 mg p.o. 4 times per day. 5. Neurontin 900 mg p.o. t.i.d. 6. Ortho-Cyclen 1 tablet p.o. daily. 7. Phenergan 25 mg p.o. q.6 h. p.r.n. nausea. 8. Sertraline 100 mg p.o. q.p.m. 9. Trazodone 200 mg p.o. nightly.  IMAGING: 1. Abdominal x-ray on December 13, 2010, impression:     a.     Nonobstructive bowel gas pattern.     b.     Status post cholecystectomy. 2. CT of the abdomen and pelvis with contrast, impression:  Suggestion     of mild colonic wall thickening and mucosal enhancement throughout     much of the colon sparing the sigmoid colon and rectum.  Cannot     exclude mild colitis.  Scattered air-fluid levels.  LABORATORY DATA:  Stool cultures, no Salmonella, shigella, Campylobacter, or Yersinia isolated.  Basic metabolic panel on the December 15, 2010, within normal limits except glucose of 113.  CBC is unremarkable except for  platelets of 143.  Stool was negative for Giardia or Cryptosporidium.  Stool C. diff PCR was positive.  Blood cultures x2 are no growth to date.  Phosphorus 2.8, magnesium 2.1. Hepatic panel within normal limits.  Urine pregnancy test was negative. Urinalysis did not show features of urinary tract infection.  Lipase was 22.  Admitting white blood cell was 9.1 and platelets 145.  CONSULTATIONS:  Gastroenterology, Dr. Claudette Head and Dr. Jeani Hawking.  DIET:  Low-residue diet as tolerated.  ACTIVITY:  Ad lib.  TODAY'S COMPLAINTS:  The patient had one soft stool overnight.  There is no blood.  No further vomiting.  The patient has tolerated diet.  She has eaten Corn dog and [place] fries brought to her by her friends or family.  She has also tolerated a hamburger.  She continues to complain of significant abdominal pain, although she does not appear in pain and maintains a log in-hospital for pain medications and nausea medications, and requests them on the dot when they are due.  PHYSICAL EXAMINATION:  GENERAL:  The patient is in no obvious distress. VITAL SIGNS:  Temperature is 98.6 degrees Fahrenheit, pulse 76 per minute, respiration 20 per minute, blood pressure 120/78  mmHg, and saturating at 96% on room air. RESPIRATORY:  Clear. No increased work of breathing. CARDIOVASCULAR:  First and second heart sounds heard regular. ABDOMEN:  Nondistended, soft, diffuse fluctuating and subjective tenderness.  No rigidity, guarding or rebound.  Bowel sounds normally heard. CENTRAL NERVOUS SYSTEM:  The patient is awake, alert, oriented x3 with no focal neurological deficits. PSYCHIATRY:  No suicidal or homicidal ideations or delusions or hallucinations.  HOSPITAL COURSE:  [name] is a 30 year old female patient who gets her care at the [facility].  She has history of hepatitis C, but no confirmed history of inflammatory bowel disease or  pancreatitis.  She has history of polysubstance abuse, opioid dependence/abuse.  She has missed appointments with her GI MDs, ID MDs, and her Pain physician.  She now presented with nausea, vomiting, abdominal pain, and diarrhea.  Her CT of the abdomen was suggestive of colitis.  Her C. difficile PCR returned positive. 1. C. difficile colitis.  The patient was placed on contact isolation     and was initially started off on Zosyn and Flagyl before the C.     difficile results returned.  Once her results came back positive,     her Zosyn was stopped.  She was continued on Flagyl and was started     on Florastor.  There were about 48 hours' time during which she was     getting suboptimal doses of Flagyl.  She is to complete total 14     days' course of this antibiotic.  She has been advised to avoid     new antibiotic use or proton pump inhibitors.  With these measures,     her nausea and vomiting have resolved.  Her stool's frequency has     reduced and are more formed.  She continues to complain of     abdominal pain for which see the discussion #2. 2. Acute on chronic abdominal pain.  As indicated, she has history of     opioid dependence and possible abuse.  She also exhibited features     of opioid-seeking behavior.  Dr. Elnoria Howard kindly saw the patient     yesterday and indicated that he is familiar with the patient and     the last thing she needs is more pain medications.  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  She also according to     him has history of substance abuse and component of drug-seeking     behavior.  Although she might have had some acute pain from her     recent colitis, she has not appeared in pain and when the MDs or     the nursing staff have gone in, she has been found to be pleasantly     talking on the phone and the moment she sees the staff, she starts     writhing in pain.  She maintains a log of the pain medications and     the nausea  medications and requests them on the dot even though she     has not appeared in pain.  The advice to reduce her pain     medications bring on apprehension in her.  She has been advised to     follow up with her MDs at [facility] including her Pain MDs and     consider tapering down these pain medications and eventually     discontinue because  they may be also a contributing factor to her     chronic pain. 3. Thrombocytopenia, stable. 4. History of bipolar disorder.  The patient indicates that she has     been on the above doses of medication for a long time.  We will     defer management of these medications to her MDs at [facility]. 5. Polysubstance abuse.  Cessation counseling done. 6. History of seizure disorder.  The patient has continued her home     dose of benzodiazepines and Neurontin.  She has not had any     seizures in the hospital.  DISPOSITION:  The patient is discharged home in stable condition.  FOLLOWUP RECOMMENDATIONS:  The patient is to follow up with her primary care physicians, GI MDs and Pain MDs at [facility].  She indicates that she has appointment with either her primary MD or her GI MD on January 06, 2011.  We will provide a short supply of pain medications to tide her over her acute phase if she truly has pain.  Time taken in coordinating this discharge is 40 minutes.     Marcellus Scott, MD     AH/MEDQ  D:  12/15/2010  T:  12/15/2010  Job:  [id number]  Electronically Signed by Marcellus Scott MD on 12/31/2010 04:13:03 PM

## 2011-01-01 LAB — CBC
Hemoglobin: 13.6 g/dL (ref 12.0–15.0)
MCHC: 34.5 g/dL (ref 30.0–36.0)
RBC: 4.34 MIL/uL (ref 3.87–5.11)
WBC: 5 10*3/uL (ref 4.0–10.5)

## 2011-01-01 LAB — BASIC METABOLIC PANEL
CO2: 28 mEq/L (ref 19–32)
Chloride: 108 mEq/L (ref 96–112)
GFR calc non Af Amer: >60 mL/min (ref >60)
Glucose, Bld: 83 mg/dL (ref 70–99)
Potassium: 4.1 mEq/L (ref 3.5–5.1)
Sodium: 143 mEq/L (ref 135–145)

## 2011-01-02 LAB — CBC
Hemoglobin: 14.2 g/dL (ref 12.0–15.0)
MCV: 90.5 fL (ref 78.0–100.0)
Platelets: 138 10*3/uL — ABNORMAL LOW (ref 150–400)
RBC: 4.51 MIL/uL (ref 3.87–5.11)
WBC: 5.2 10*3/uL (ref 4.0–10.5)

## 2011-01-02 LAB — COMPREHENSIVE METABOLIC PANEL
ALT: 63 U/L — ABNORMAL HIGH (ref 0–35)
AST: 54 U/L — ABNORMAL HIGH (ref 0–37)
CO2: 32 mEq/L (ref 19–32)
Chloride: 103 mEq/L (ref 96–112)
Creatinine, Ser: 0.58 mg/dL (ref 0.50–1.10)
GFR calc non Af Amer: >60 mL/min (ref >60)
Sodium: 139 mEq/L (ref 135–145)
Total Bilirubin: 0.2 mg/dL — ABNORMAL LOW (ref 0.3–1.2)

## 2011-01-04 LAB — STOOL CULTURE

## 2011-01-04 LAB — BASIC METABOLIC PANEL
Chloride: 104 mEq/L (ref 96–112)
GFR calc Af Amer: >60 mL/min (ref >60)
GFR calc non Af Amer: >60 mL/min (ref >60)
Potassium: 4.4 mEq/L (ref 3.5–5.1)
Sodium: 139 mEq/L (ref 135–145)

## 2011-01-04 LAB — C1 ESTERASE INHIBITOR, FUNCTIONAL: C1INH Functional/C1INH Total MFr SerPl: 86 % (ref >=68)

## 2011-01-05 ENCOUNTER — Inpatient Hospital Stay (HOSPITAL_COMMUNITY): Payer: Self-pay

## 2011-01-05 LAB — CBC
Hemoglobin: 14.3 g/dL (ref 12.0–15.0)
Platelets: 136 10*3/uL — ABNORMAL LOW (ref 150–400)
RBC: 4.51 MIL/uL (ref 3.87–5.11)
WBC: 8.9 10*3/uL (ref 4.0–10.5)

## 2011-01-05 LAB — BASIC METABOLIC PANEL
CO2: 25 mEq/L (ref 19–32)
Glucose, Bld: 93 mg/dL (ref 70–99)
Potassium: 4.4 mEq/L (ref 3.5–5.1)
Sodium: 139 mEq/L (ref 135–145)

## 2011-01-06 LAB — PRO B NATRIURETIC PEPTIDE: Pro B Natriuretic peptide (BNP): 36 pg/mL (ref 0–125)

## 2011-01-06 LAB — CBC
Hemoglobin: 14.9 g/dL (ref 12.0–15.0)
MCH: 32.3 pg (ref 26.0–34.0)
MCV: 91.5 fL (ref 78.0–100.0)
Platelets: 138 10*3/uL — ABNORMAL LOW (ref 150–400)
RBC: 4.61 MIL/uL (ref 3.87–5.11)
WBC: 7.9 10*3/uL (ref 4.0–10.5)

## 2011-01-07 LAB — BASIC METABOLIC PANEL
Calcium: 9.1 mg/dL (ref 8.4–10.5)
GFR calc non Af Amer: >60 mL/min (ref >60)
Glucose, Bld: 83 mg/dL (ref 70–99)
Potassium: 4.2 mEq/L (ref 3.5–5.1)
Sodium: 138 mEq/L (ref 135–145)

## 2011-01-07 LAB — FECAL LACTOFERRIN, QUANT

## 2011-01-08 LAB — PORPHYRINS, FRACTIONATED URINE (TIMED COLLECTION)
Coproporphyrin, U: 119.7 mcg/24 h (ref <=155)
Uroporphyrin, Urine: 10.2 mcg/24 h (ref 3.3–29.5)
Volume, Urine-PORPF: 4000 mL

## 2011-01-11 ENCOUNTER — Inpatient Hospital Stay (HOSPITAL_COMMUNITY)
Admission: EM | Admit: 2011-01-11 | Discharge: 2011-01-15 | DRG: 373 | Disposition: A | Discharge status: Home / Self Care | Payer: Self-pay | Attending: Internal Medicine | Admitting: Internal Medicine

## 2011-01-11 ENCOUNTER — Emergency Department (HOSPITAL_COMMUNITY): Payer: Self-pay

## 2011-01-11 DIAGNOSIS — E876 Hypokalemia: Secondary | ICD-10-CM | POA: Diagnosis present

## 2011-01-11 DIAGNOSIS — R197 Diarrhea, unspecified: Secondary | ICD-10-CM | POA: Diagnosis present

## 2011-01-11 DIAGNOSIS — A0472 Enterocolitis due to Clostridium difficile, not specified as recurrent: Principal | ICD-10-CM | POA: Diagnosis present

## 2011-01-11 DIAGNOSIS — R109 Unspecified abdominal pain: Secondary | ICD-10-CM | POA: Diagnosis present

## 2011-01-11 DIAGNOSIS — Z91199 Patient's noncompliance with other medical treatment and regimen due to unspecified reason: Secondary | ICD-10-CM

## 2011-01-11 DIAGNOSIS — Z9119 Patient's noncompliance with other medical treatment and regimen: Secondary | ICD-10-CM

## 2011-01-11 LAB — URINALYSIS, ROUTINE W REFLEX MICROSCOPIC
Bilirubin Urine: NEGATIVE
Hgb urine dipstick: NEGATIVE
Ketones, ur: NEGATIVE mg/dL
Nitrite: NEGATIVE
Protein, ur: NEGATIVE mg/dL
Urobilinogen, UA: 0.2 mg/dL (ref 0.0–1.0)

## 2011-01-11 LAB — POCT PREGNANCY, URINE: Preg Test, Ur: NEGATIVE

## 2011-01-11 LAB — COMPREHENSIVE METABOLIC PANEL
ALT: 98 U/L — ABNORMAL HIGH (ref 0–35)
Albumin: 4.5 g/dL (ref 3.5–5.2)
Calcium: 10 mg/dL (ref 8.4–10.5)
GFR calc Af Amer: >60 mL/min (ref >60)
Glucose, Bld: 96 mg/dL (ref 70–99)
Sodium: 136 mEq/L (ref 135–145)
Total Protein: 8.2 g/dL (ref 6.0–8.3)

## 2011-01-11 LAB — POCT I-STAT TROPONIN I: Troponin i, poc: 0.01 ng/mL (ref 0.00–0.08)

## 2011-01-11 LAB — DIFFERENTIAL
Basophils Absolute: 0 10*3/uL (ref 0.0–0.1)
Basophils Relative: 0 % (ref 0–1)
Eosinophils Absolute: 0.1 10*3/uL (ref 0.0–0.7)
Eosinophils Relative: 2 % (ref 0–5)
Monocytes Absolute: 0.5 10*3/uL (ref 0.1–1.0)
Monocytes Relative: 8 % (ref 3–12)
Neutro Abs: 4.7 10*3/uL (ref 1.7–7.7)

## 2011-01-11 LAB — CBC
Hemoglobin: 15.8 g/dL — ABNORMAL HIGH (ref 12.0–15.0)
MCH: 31.4 pg (ref 26.0–34.0)
MCHC: 35 g/dL (ref 30.0–36.0)
RDW: 13.5 % (ref 11.5–15.5)

## 2011-01-11 LAB — LIPASE, BLOOD: Lipase: 58 U/L (ref 11–59)

## 2011-01-12 ENCOUNTER — Inpatient Hospital Stay (HOSPITAL_COMMUNITY): Payer: Self-pay

## 2011-01-12 LAB — DIFFERENTIAL
Basophils Absolute: 0 10*3/uL (ref 0.0–0.1)
Lymphocytes Relative: 43 % (ref 12–46)
Lymphs Abs: 2.8 10*3/uL (ref 0.7–4.0)
Monocytes Absolute: 0.6 10*3/uL (ref 0.1–1.0)
Neutro Abs: 2.9 10*3/uL (ref 1.7–7.7)

## 2011-01-12 LAB — BASIC METABOLIC PANEL
BUN: 13 mg/dL (ref 6–23)
Chloride: 107 mEq/L (ref 96–112)
Glucose, Bld: 80 mg/dL (ref 70–99)
Potassium: 2.9 mEq/L — ABNORMAL LOW (ref 3.5–5.1)

## 2011-01-12 LAB — CBC
HCT: 37 % (ref 36.0–46.0)
Hemoglobin: 12.8 g/dL (ref 12.0–15.0)
WBC: 6.5 10*3/uL (ref 4.0–10.5)

## 2011-01-12 MED ORDER — IOHEXOL 300 MG/ML  SOLN
100.0000 mL | Freq: Once | INTRAMUSCULAR | Status: AC | PRN
  Administered 2011-01-12: 100 mL via INTRAVENOUS

## 2011-01-12 NOTE — Consult Note (Signed)
NAMEDESTANE, SPEAS NO.:  [id number]  MEDICAL RECORD NO.:  [id number]  LOCATION:  5001                         FACILITY:  [facility]  PHYSICIAN:  Juanetta Gosling, MDDATE OF BIRTH:  Nov 17, 1980  DATE OF CONSULTATION:  12/30/2010 DATE OF DISCHARGE:                                CONSULTATION   REQUESTING PHYSICIAN:  Lonia Blood, MD  CONSULTING SURGEON:  Juanetta Gosling, MD  PRIMARY CARE PHYSICIAN:  I believe is a resident at [facility] under Internal Medicine Clinic.  REASON FOR CONSULTATION:  Abdominal pain, enteritis.  HISTORY OF PRESENT ILLNESS:  [name] as a 30 year old white female with a history of chronic abdominal pain, hepatitis B, pancreatitis, anxiety disorder, bipolar disorder, and possible Crohn disease who was recently discharged approximately two and half weeks ago after a stay for Clostridium difficile colitis.  Since discharge, the patient has been feeling better, however, she states she has continued to have some diarrhea.  She did have her recent C. diff test taken on December 21, 2010, however, this was negative.  Currently, a C. diff test is pending.  This past Friday, the patient states that she continued to have some diarrhea.  However, she began to get increase in abdominal pain.  She does have chronic abdominal pain, but felt this was worsening in her left upper quadrant down towards her left lower quadrant.  She states that associated with this she got a racing heart, numbness in her fingers and pain.  She did have some nausea, but no emesis.  She presented to the emergency department due to worsening pain last night. Upon arrival, she was found to have normal labs.  She did have a CT scan, which revealed abnormal proximal small bowel dilatation with folds compatible with proximal enteritis of the descending duodenum.  The patient had been told apparently in the past that she has Crohn disease,  however, there is no documentation to support that or biopsies to support that and the patient denies actually having Crohn disease.  REVIEW OF SYSTEMS:  Please see HPI.  Otherwise, all other systems have been reviewed and are negative.  FAMILY HISTORY:  Currently noncontributory.  PAST MEDICAL HISTORY: 1. Chronic abdominal pain. 2. History of pancreatitis. 3. Recent Clostridium difficile colitis. 4. Chronic hepatitis C. 5. Bipolar disorder. 6. Anxiety disorder. 7. Migraine headaches.  PAST SURGICAL HISTORY: 1. Appendectomy. 2. Cholecystectomy. 3. Surgery on her right leg.  SOCIAL HISTORY:  The patient has a 15-1/2-year-old son.  She admits to smoking a pack of cigarettes a day.  She denies any alcohol or illicit drug abuse.  However, she does have a history of polysubstance abuse.  ALLERGIES: 1. SULFA. 2. DARVOCET. 3. BACTRIM.  MEDICATIONS AT HOME:  Per e-chart as I do not currently have a list include: 1. Neurontin. 2. Zoloft. 3. Trazodone. 4. Klonopin. 5. Dilaudid. 6. Phenergan.  PHYSICAL EXAMINATION:  GENERAL:  [name] is a 29 year old female who is currently lying in bed, in no acute distress, but is otherwise well developed and well nourished. VITAL SIGNS:  Temperature 98.4, pulse 75, respirations 20, and blood pressure 110/72. HEENT:  Head is normocephalic and atraumatic.  Sclerae are noninjected. Pupils are equal,  round, and reactive to light.  Ears and nose without any obvious masses or lesions.  No rhinorrhea.  Mouth is pink.  Throat shows no exudate. HEART:  Regular rate and rhythm.  Normal S1 and S2.  No murmurs, gallops, or rubs are noted.  She does have palpable carotid, radial, and pedal pulses bilaterally. LUNGS:  Clear to auscultation bilaterally with no wheezes, rhonchi, or rales noted.  Respiratory effort is nonlabored. ABDOMEN:  Soft with mild left upper quadrant and left lower quadrant tenderness.  She denies any right lower quadrant  or right upper quadrant or epigastric tenderness.  She does have active bowel sounds and is nondistended.  She does not have any rebounding or peritoneal signs.  No masses, hernias, or organomegaly noted.  She does have multiple tattoos on her abdomen. MUSCULOSKELETAL:  All 4 extremities are symmetrical with no cyanosis, clubbing, or edema. PSYCH:  The patient is alert and oriented x3 with an appropriate affect.  LABORATORY DATA:  Lipase is 19.  Sodium 136, potassium 4.0, glucose 92, BUN 10, creatinine 0.50, total bilirubin 0.5, alkaline phosphatase 70, AST 38, ALT 64.  White blood cell count is 6700, hemoglobin 16.0, hematocrit 44.6, and platelet count is 144,000.  DIAGNOSTICS:  CT scan of the abdomen and pelvis reveals some proximal dilatation of the descending duodenum and the proximal jejunum.  This is consistent with proximal enteritis versus an ileus.  IMPRESSION: 1. Proximal enteritis. 2. Recent history of Clostridium difficile colitis. 3. Hepatitis C. 4. Chronic abdominal pain. 5. Chronic pancreatitis.  PLAN:  Currently, the patient's abdomen is relatively benign.  I have discussed the CT scan with the radiologist as the initial report reading is conflicting as the body of the report talks about distal duodenitis, however, the impression states proximal ileitis.  It is felt after my review and the radiologist's review that the patient likely has either an ileus of the distal duodenum into proximal jejunum versus a very mild enteritis.  I believe this can be treated conservatively with bowel rest and antibiotics as currently being done.  I do not see any evidence of any surgical indications such as volvulus or small bowel obstruction.  I agree if the patient does not improve with conservative management that a GI consultation may be warranted for possible endoscopy if necessary. Thank you for this consultation.  Please call us as needed.     Letha Cape,  PA   ______________________________ Juanetta Gosling, MD    KEO/MEDQ  D:  12/30/2010  T:  12/30/2010  Job:  [id number]  Electronically Signed by Barnetta Chapel PA on 01/12/2011 02:31:24 PM Electronically Signed by Emelia Loron MD on 01/12/2011 07:26:14 PM

## 2011-01-13 LAB — BASIC METABOLIC PANEL
BUN: 3 mg/dL — ABNORMAL LOW (ref 6–23)
Chloride: 108 mEq/L (ref 96–112)
GFR calc Af Amer: >60 mL/min (ref >60)
Potassium: 3.6 mEq/L (ref 3.5–5.1)
Sodium: 139 mEq/L (ref 135–145)

## 2011-01-13 NOTE — Consult Note (Signed)
[name]        ACCOUNT NO.:  [id number]  MEDICAL RECORD NO.:  [id number]  LOCATION:  5001                         FACILITY:  [facility]  PHYSICIAN:  James L. Malon Kindle., M.D.DATE OF BIRTH:  Jul 05, 1980  DATE OF CONSULTATION: DATE OF DISCHARGE:                                CONSULTATION   Referred by [facility].  PRIMARY CARE PHYSICIAN:  [facility].  PRIMARY GASTROENTEROLOGIST:  None, unassigned.  Has previously seen Dr. Elnoria Howard.  HISTORY:  A 30 year old woman who we are asked to see for evaluation of chronic pain.  She relates this chronic pain to delivery of her son in 2008 that was described as an outlet vacuum assisted vaginal delivery that apparently was non-remarkable.  She apparently had pain following this delivery and was seen briefly by Dr. Riley Kill in the Pain Clinic and the feeling was that this could have been due to fibromyalgia or something of this nature.  She has continued to have multiple problems with chronic abdominal pain.  She has been diagnosed with hepatitis C, fibromyalgia, and has had previous appendectomy as well as cholecystectomy.  She states that she had her gallbladder removed about 4 years ago after the birth of her child and she continued to have pain and was losing weight.  She was being followed by Dr. Elnoria Howard at that time who had performed EGD, x-rays, etc.  His opinion was that this was due to the gallbladder and she ended up with a cholecystectomy after an ultrasound had revealed cholelithiasis.  This was in the postpartum state and was felt to be the cause of her pain.  Unfortunately, following this she has continued to have the same pain.  Has had multiple admissions here to [facility].  She has been seen by Psych here.  She has a history of IV narcotics use in the distant past.  She had been abstinent from heroin for 5 or 6 years and apparently during this time period had contracted hepatitis C.  Her  abdominal pain continued and she has been back receiving chronic pain medications.  She has subsequently been to [facility] and has been admitted there multiple times as well as here.  Her primary care physician is at the [facility] and was recently changed. She has been diagnosed here with mild gastroparesis, fibromyalgia, and chronic pain syndrome.  She has had lower extremity pain and had been following up at the clinic here.  During this time, she has had multiple ER visits.  She has seen multiple gastroenterologists here including our practice alone, unassigned call.  She had a colonoscopy in 2009 requiring enormous amount of medications.  It was negative after reporting some rectal bleeding.  She apparently has had colonoscopies and endoscopies over at [facility] and for the past 2 years reports multiple admissions there.  She has had what is described as EGDs and colonoscopies and reports that she needs general anesthesia for these procedures due to her chronic narcotic use.  She states that she is interested in getting off of narcotics and trying to get along with her life.  She was hospitalized over at [facility] in the spring, had either an endoscopy  or colonoscopy.  She is not sure which that was reported to be negative with negative biopsies.  Approximately 3 weeks ago, she was admitted.  Based on her evaluation at [facility], she now carries a diagnosis of Crohn disease and chronic pancreatitis.  At this point the basis of that diagnosis is unclear.  The patient reports that to the best of her knowledge she has never had any biopsy that had shown Crohn disease.  She has only mentioned once and she does not feel that she has Crohn disease.  She was hospitalized here after coming to the emergency room complaining of upper abdominal pain.  She was on Neurontin, trazodone, Zoloft, Klonopin, birth control pills, and Dilaudid which she had been  obtaining from [facility].  Labs and exam were non-remarkable.  There was mild thickness of the colon.  The patient was discharged after becoming positive for C. diff and being treated.  She was discharged on Flagyl.  It was felt that this infectious C. diff was superimposed on her chronic pain syndrome. Instructions were to follow up with her primary care physician at [facility] which she has not as of yet been able to do.  She was readmitted after about 3 weeks with continued pain and was seen in consultation by Dr. Dwain Sarna.  She was having some loose stools, continuing to have both upper and lower pain, nausea, but no emesis.  This is all very similar to what she had before.  Small bowel was dilated on CT and a diagnosis of proximal enteritis was made.  Dr. Doreen Salvage opinion was that the patient did not have a surgical abdomen.  We are asked to see her regarding an opinion about her abdominal pain.  She reports that this is the same pain that she has had for the past 5 years or longer.  It started after delivery and is nearly constant requiring narcotic pain medications.  It is worse when she eats.  It will improve occasionally.  It rarely that she go more than 2 or 3 weeks without another exacerbation of her pain requiring hospitalization either here or at [facility].  She seems quite distraught that nothing had really seemed to help her pain.  Pertinent labs on this admission reveal a normal hemoglobin and white count, slightly elevated liver function test, negative urinalysis.  She has had a CT of her head.  It was negative along with a negative CT of the C-spine following her headache and a CT of the pelvis showing some small bowel air-fluid levels with a normal appearing pancreas.  MEDICATIONS ON ADMISSION:  Neurontin, Zoloft, trazodone, Klonopin, Dilaudid, and Phenergan.  ALLERGIES:  The patient is allergic to SULFUR, DARVON, and SEPTRA.  MEDICAL HISTORY: 1. Chronic  abdominal pain diagnosed by her history as fibromyalgia,     chronic pancreatitis, Crohn disease, and generalized chronic pain     syndrome. 2. 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 3. History of recent C. diff infection with positive C. diff toxin. 4. History of chronic hepatitis C. 5. Diagnosis of chronic anxiety disorder, bipolar disorder,     fibromyalgia.  PREVIOUS SURGERIES:  Appendectomy, cholecystectomy with gallstones present in the gallbladder.  SOCIAL HISTORY:  The patient has a 76-1/2-year-old son.  She is married and her exact living situation is unclear.  She is a bit evasive on this.  She does not use alcohol or illicit drugs currently but does have a history of this.  She does smoke.  PHYSICAL EXAMINATION:  VITAL SIGNS:  Temperature 98.4, pulse 75, blood pressure 110/72. GENERAL:  A pleasant white female in no acute distress. EYES:  Sclerae nonicteric. NECK:  Supple with no lymphadenopathy. LUNGS:  Clear. HEART:  Regular rate and rhythm without murmurs or gallops. ABDOMEN:  Nondistended and soft with normal bowel sounds with subjective tenderness throughout.  ASSESSMENT:  Chronic abdominal pain - the cause of this pain is not clear.  She has had extensive workup here as well as I am sure at [facility].  She describes endoscopic procedures and x-ray such as ultrasounds and CT scans and according to her, no etiology of her pain has clearly been shown.  She is worried of Crohn's but not sure if she has Crohn's.  There is no chronic calcifications on the CT.  I am not really sure where the diagnosis of chronic pancreatitis came from.  At this point, I would consider more unusual findings such as porphyria and hereditary angioedema.  I hope these have been evaluated for why she is here and we probably need to go ahead and do this.  It may well be that this is simply chronic narcotic use with narcotic bowel.  PLAN:  We will go ahead and check a  24-hour urine for porphyrin screen and C1 esterase inhibitor level.  We would try to minimize her narcotics.          ______________________________ Llana Aliment Malon Kindle., M.D.     Waldron Session  D:  12/30/2010  T:  12/30/2010  Job:  [id number]  cc:   [facility]  Electronically Signed by Carman Ching M.D. on 01/13/2011 05:26:01 PM

## 2011-01-14 DIAGNOSIS — R109 Unspecified abdominal pain: Secondary | ICD-10-CM

## 2011-01-14 DIAGNOSIS — K589 Irritable bowel syndrome without diarrhea: Secondary | ICD-10-CM

## 2011-01-14 DIAGNOSIS — R197 Diarrhea, unspecified: Secondary | ICD-10-CM

## 2011-01-14 LAB — BASIC METABOLIC PANEL
Calcium: 9.5 mg/dL (ref 8.4–10.5)
GFR calc Af Amer: >60 mL/min (ref >60)
GFR calc non Af Amer: >60 mL/min (ref >60)
Potassium: 3.6 mEq/L (ref 3.5–5.1)
Sodium: 141 mEq/L (ref 135–145)

## 2011-01-14 LAB — CBC
MCH: 31.1 pg (ref 26.0–34.0)
MCHC: 34.4 g/dL (ref 30.0–36.0)
Platelets: 136 10*3/uL — ABNORMAL LOW (ref 150–400)
RDW: 13.4 % (ref 11.5–15.5)

## 2011-01-15 DIAGNOSIS — R197 Diarrhea, unspecified: Secondary | ICD-10-CM

## 2011-01-15 DIAGNOSIS — K589 Irritable bowel syndrome without diarrhea: Secondary | ICD-10-CM

## 2011-01-15 DIAGNOSIS — R109 Unspecified abdominal pain: Secondary | ICD-10-CM

## 2011-01-16 LAB — STOOL CULTURE

## 2011-01-17 NOTE — Discharge Summary (Signed)
NAMEAUSTRALIA, DROLL        ACCOUNT NO.:  [id number]  MEDICAL RECORD NO.:  [id number]  LOCATION:  1309                         FACILITY:  [facility]  PHYSICIAN:  Marinda Elk, M.D.DATE OF BIRTH:  1981-02-01  DATE OF ADMISSION:  01/11/2011 DATE OF DISCHARGE:  01/15/2011                              DISCHARGE SUMMARY   PRIMARY CARE DOCTOR:  None.  CHIEF COMPLAINT:  Abdominal pain and chest pain.  DISCHARGE DIAGNOSES: 1. 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 2. Hypokalemia. 3. Abdominal pain.  DISCHARGE MEDICATIONS: 1. Cholestyramine 4 g p.o. daily. 2. Flagyl 500 mg q.8 hours. 3. Dilaudid 4 mg tablet t.i.d. 4. Klonopin 2 mg four times a day. 5. Neurontin 300 mg t.i.d. 6. Norco 10/325 one tablet t.i.d. 7. Ortho Tri-Cyclen 1 tablet daily. 8. Promethazine 25 mg q.6 hours. 9. Sertraline 100 mg at bedtime. 10.Trazodone 100 mg at bedtime 2 tablets.  PROCEDURES PERFORMED:  CT scan of the abdomen and pelvis showed no acute findings, borderline splenomegaly.  Ultrasound of the pelvic showed 3.5 complex right ovarian cyst with indeterminate, but probable benign characteristics.  Normal appearance of her uterus and left ovary. Abdominal x-ray, no acute findings.  CONSULTANTS:  GI, Rachael Fee, MD  BRIEF ADMITTING HISTORY AND PHYSICAL:  This is a 30 year old female, who presented to the emergency room secondary to worsening abdominal pain, nausea, vomiting, and diarrhea.  The patient reported that a month ago, she had been hospitalized and treated for C. diff colitis and was discharged to home with continued vancomycin therapy orally; however, she was unable to afford the prescription of vancomycin.  She reported that her symptoms progressively got worse to the point where she has abdominal pain 10/10 with liquidy stools.  PHYSICAL EXAMINATION:  VITAL SIGNS:  Temperature 98, blood pressure 109/76, heart rate of 94, respirations of 20.  She is  saturating 94% on room air.  Her physical exam was unremarkable except for positive bowel sounds, tender diffusely.  There is no rebound or guarding.  LABS ON ADMISSION:  Show urine pregnancy test was negative x1.  First set of cardiac markers negative x1.  Her UA does not show any kind of infection.  Her white count of 7.2, her hemoglobin of 15, platelet count of 187, ANC of 4.7.  Her sodium is 136, potassium 3.3, chloride 102, bicarb 22, glucose of 96, BUN of 12, creatinine of 0.5.  LFTs within normal limits except for her AST, which is 43 and ALT is 98.  Her lipase was 58.  BRIEF HOSPITAL COURSE: 1. Diarrhea, most likely secondary to recurrent C. diff secondary to     noncompliance of her medication.  She was admitted to the hospital.     She was started on vancomycin.  Her diarrhea decreased.  She was     changed to Flagyl due to she could not afford it and her diarrhea     continued to decrease.  She remained afebrile.  Her white count     came down, so she will finish her Flagyl as an outpatient.       Actually, this is third treatment of C. diff colitis, where she      has not finished her treatment.  C. diff PCR was repeated, it was     negative.  GI was consulted.  They recommended not to do     colonoscopy, but to continue to treat the C. diff.  So at this     time, she will finish her Flagyl treatment as an outpatient.      Our social worker has helped her with part of the treatment and      she will obtain the rest.      She was discharged in stable condition.  Her diarrhea has come down.     Significantly, she does not have any white count or fever.  2. Hypokalemia.  This was secondary to diarrhea, this repleted.  3. Abdominal pain.  GI was consulted.  They related they could not do     any further treatment for her abdominal pain.  She needs to follow     up with the Pain Clinic and her gastroenterologist over at [facility].     This could be narcotic hyperalgesia, IBS,  and/or her C. diff.  At     this time, her pain is not improved, but her diarrhea is almost     resolving, so there might also be psychological and physiological     component to her narcotic abuse.  She was     discharged in stable condition.  Vitals on day of discharge show temperature 98, pulse 93, respirations 18, blood pressure 116/81.  She was saturating 92% on room air.  LABS ON DAY OF DISCHARGE:  None.     Marinda Elk, M.D.     AF/MEDQ  D:  01/15/2011  T:  01/16/2011  Job:  [id number]  cc:   [facility]  Electronically Signed by Marinda Elk M.D. on 01/17/2011 06:59:06 AM

## 2011-01-17 NOTE — H&P (Signed)
[name], [name]        ACCOUNT NO.:  [id number]  MEDICAL RECORD NO.:  [id number]  LOCATION:  1309                         FACILITY:  [facility]  PHYSICIAN:  Della Goo, M.D. DATE OF BIRTH:  01/13/1981  DATE OF ADMISSION:  01/11/2011 DATE OF DISCHARGE:                             HISTORY & PHYSICAL   PRIMARY CARE PHYSICIAN:  None.  CHIEF COMPLAINT:  Abdominal pain, chest pain.  HISTORY OF PRESENT ILLNESS:  This is a 30 year old female who presents to the emergency department emergently secondary to worsening abdominal pain, nausea, vomiting, and diarrhea.  The patient reports a month ago being hospitalized and treated for C difficile colitis and was discharged to home to continue on vancomycin therapy orally; however, she was unable to get the prescription for the vancomycin and reports that the symptoms steadily returned and continued to worsen.  She states that she has 10/10 abdominal pain which is crampy.  She reports not being able to eat and drink and hold down foods and liquids very well. She denies having any fevers or chills.  PAST MEDICAL HISTORY:  Significant for, 1. Polysubstance abuse. 2. Hepatitis C. 3. History of pancreatitis. 4. Asthma. 5. Recent C difficile colitis. 6. Anxiety and depression.  PAST SURGICAL HISTORY: 1. History of cholecystectomy. 2. Appendectomy. 3. Right leg laceration repair. 4. Septoplasty secondary to a nasal fracture.  ALLERGIES:  To BACTRIM which causes hematuria and DARVOCET N 100, which is an intolerance and causes nausea.  MEDICATIONS:  Include Dilaudid.  SOCIAL HISTORY:  The patient is a smoker.  She smokes a pack of cigarettes daily since age 30.  She is a nondrinker.  She has a history of cocaine and marijuana abuse.  FAMILY HISTORY:  Positive cancer in her mother who had ovarian cancer. No coronary artery disease, hypertension, or diabetes in her family.  REVIEW OF SYSTEMS:  Review of systems, pertinents  mentioned above.  PHYSICAL EXAMINATION FINDINGS:  GENERAL: This is a 30 year old well- nourished, well-developed, anxious female who is in discomfort but no acute distress. VITAL SIGNS: Temperature 98.2, blood pressure 109/76, heart rate 94, respirations 20, O2 saturations 94% to 98%. HEENT: Examination normocephalic, atraumatic.  Pupils equally round and reactive to light.  Extraocular movements are intact, funduscopic benign.  There is no scleral icterus.  Nares are patent bilaterally. Oropharynx is clear. NECK: Supple full range of motion.  No thyromegaly, adenopathy, or jugular venous distention. CARDIOVASCULAR:  Regular rate and rhythm.  No murmurs, gallops, or rubs appreciated. LUNGS:  Clear lung fields.  No rales, rhonchi, or wheezes.  Breathing is unlabored.  Chest wall excursion is symmetric.  Chest wall is nontender. ABDOMEN:  Positive bowel sounds, soft, tender diffusely.  There is no rebound or guarding. EXTREMITIES:  There is no hepatosplenomegaly.  Extremities: Without cyanosis, clubbing, or edema. NEUROLOGIC:  Nonfocal.  LABORATORY STUDIES: 1. White blood cell count 7.0, hemoglobin 15.8, hematocrit 45.1, MCV     89.7, platelets 187, neutrophils 67% lymphocytes 24%.  Sodium 136,     potassium 3.3, chloride 102, carbon dioxide 22, BUN 12, creatinine     0.55, glucose 96, albumin 4.5, AST 43, ALT 98, lipase 58.  Cardiac     enzymes, troponin 0.01.  Chest x-ray  reveals no acute disease     process.  Urine pregnancy test was negative and urinalysis also     negative.  Urine was concentrated with a specific gravity of 1.026.     The patient will be admitted to be placed and she will be placed on     field precautions.  A C Difficile PCR study will be ordered along     with a stool culture and sensitivity.  The patient will be placed     on the C difficile protocol of treatment and vancomycin orally will     be given.  IV fluids have been ordered for fluid  resuscitation.  ASSESSMENT:  Hypotension.  PLAN:  Antiemetic therapy will be ordered and pain control therapy will be ordered.  Her electrolytes will be monitored and adjusted as needed. DVT prophylaxis has been ordered and further workup will ensue pending results the patient's clinical course.     Della Goo, M.D.     HJ/MEDQ  D:  01/12/2011  T:  01/12/2011  Job:  [id number]  Electronically Signed by Della Goo M.D. on 01/17/2011 10:28:53 PM

## 2011-01-18 ENCOUNTER — Inpatient Hospital Stay (HOSPITAL_COMMUNITY)
Admission: EM | Admit: 2011-01-18 | Discharge: 2011-01-20 | DRG: 897 | Disposition: A | Discharge status: Home / Self Care | Payer: Self-pay | Attending: Internal Medicine | Admitting: Internal Medicine

## 2011-01-18 ENCOUNTER — Inpatient Hospital Stay (HOSPITAL_COMMUNITY): Payer: Self-pay

## 2011-01-18 DIAGNOSIS — F1920 Other psychoactive substance dependence, uncomplicated: Secondary | ICD-10-CM | POA: Diagnosis present

## 2011-01-18 DIAGNOSIS — F19939 Other psychoactive substance use, unspecified with withdrawal, unspecified: Principal | ICD-10-CM | POA: Diagnosis present

## 2011-01-18 DIAGNOSIS — F3289 Other specified depressive episodes: Secondary | ICD-10-CM | POA: Diagnosis present

## 2011-01-18 DIAGNOSIS — A0472 Enterocolitis due to Clostridium difficile, not specified as recurrent: Secondary | ICD-10-CM | POA: Diagnosis present

## 2011-01-18 DIAGNOSIS — B1920 Unspecified viral hepatitis C without hepatic coma: Secondary | ICD-10-CM | POA: Diagnosis present

## 2011-01-18 DIAGNOSIS — F17200 Nicotine dependence, unspecified, uncomplicated: Secondary | ICD-10-CM | POA: Diagnosis present

## 2011-01-18 DIAGNOSIS — R112 Nausea with vomiting, unspecified: Secondary | ICD-10-CM | POA: Diagnosis present

## 2011-01-18 DIAGNOSIS — R197 Diarrhea, unspecified: Secondary | ICD-10-CM | POA: Diagnosis present

## 2011-01-18 DIAGNOSIS — F329 Major depressive disorder, single episode, unspecified: Secondary | ICD-10-CM | POA: Diagnosis present

## 2011-01-18 DIAGNOSIS — R7402 Elevation of levels of lactic acid dehydrogenase (LDH): Secondary | ICD-10-CM | POA: Diagnosis present

## 2011-01-18 DIAGNOSIS — R7401 Elevation of levels of liver transaminase levels: Secondary | ICD-10-CM | POA: Diagnosis present

## 2011-01-18 DIAGNOSIS — E860 Dehydration: Secondary | ICD-10-CM | POA: Diagnosis present

## 2011-01-18 LAB — CBC
HCT: 52.3 % — ABNORMAL HIGH (ref 36.0–46.0)
Hemoglobin: 18.9 g/dL — ABNORMAL HIGH (ref 12.0–15.0)
MCHC: 36.1 g/dL — ABNORMAL HIGH (ref 30.0–36.0)
RBC: 5.81 MIL/uL — ABNORMAL HIGH (ref 3.87–5.11)

## 2011-01-18 LAB — COMPREHENSIVE METABOLIC PANEL
ALT: 123 U/L — ABNORMAL HIGH (ref 0–35)
Alkaline Phosphatase: 106 U/L (ref 39–117)
BUN: 14 mg/dL (ref 6–23)
CO2: 17 mEq/L — ABNORMAL LOW (ref 19–32)
Chloride: 100 mEq/L (ref 96–112)
GFR calc Af Amer: >60 mL/min (ref >60)
GFR calc non Af Amer: >60 mL/min (ref >60)
Glucose, Bld: 116 mg/dL — ABNORMAL HIGH (ref 70–99)
Potassium: 3.7 mEq/L (ref 3.5–5.1)
Sodium: 135 mEq/L (ref 135–145)
Total Bilirubin: 0.6 mg/dL (ref 0.3–1.2)

## 2011-01-18 LAB — URINALYSIS, ROUTINE W REFLEX MICROSCOPIC
Protein, ur: 30 mg/dL — AB
Urobilinogen, UA: 0.2 mg/dL (ref 0.0–1.0)

## 2011-01-18 LAB — DIFFERENTIAL
Basophils Absolute: 0 10*3/uL (ref 0.0–0.1)
Basophils Relative: 0 % (ref 0–1)
Lymphocytes Relative: 23 % (ref 12–46)
Monocytes Absolute: 1 10*3/uL (ref 0.1–1.0)
Neutro Abs: 7.1 10*3/uL (ref 1.7–7.7)
Neutrophils Relative %: 66 % (ref 43–77)

## 2011-01-18 LAB — LIPASE, BLOOD: Lipase: 32 U/L (ref 11–59)

## 2011-01-18 LAB — URINE MICROSCOPIC-ADD ON

## 2011-01-18 LAB — RAPID URINE DRUG SCREEN, HOSP PERFORMED
Barbiturates: NOT DETECTED
Cocaine: NOT DETECTED
Tetrahydrocannabinol: NOT DETECTED

## 2011-01-19 ENCOUNTER — Inpatient Hospital Stay (HOSPITAL_COMMUNITY): Payer: Self-pay

## 2011-01-19 LAB — COMPREHENSIVE METABOLIC PANEL
ALT: 68 U/L — ABNORMAL HIGH (ref 0–35)
Alkaline Phosphatase: 67 U/L (ref 39–117)
Chloride: 108 mEq/L (ref 96–112)
GFR calc Af Amer: >60 mL/min (ref >60)
Glucose, Bld: 107 mg/dL — ABNORMAL HIGH (ref 70–99)
Potassium: 4.2 mEq/L (ref 3.5–5.1)
Sodium: 141 mEq/L (ref 135–145)
Total Bilirubin: 0.5 mg/dL (ref 0.3–1.2)
Total Protein: 6.2 g/dL (ref 6.0–8.3)

## 2011-01-19 LAB — CBC
HCT: 38.9 % (ref 36.0–46.0)
Hemoglobin: 13.2 g/dL (ref 12.0–15.0)
MCHC: 33.7 g/dL (ref 30.0–36.0)
RBC: 4.22 MIL/uL (ref 3.87–5.11)
WBC: 6.1 10*3/uL (ref 4.0–10.5)

## 2011-01-19 MED ORDER — GADOBENATE DIMEGLUMINE 529 MG/ML IV SOLN
15.0000 mL | Freq: Once | INTRAVENOUS | Status: AC | PRN
  Administered 2011-01-19: 15 mL via INTRAVENOUS

## 2011-01-20 LAB — BASIC METABOLIC PANEL
BUN: 6 mg/dL (ref 6–23)
CO2: 26 mEq/L (ref 19–32)
Chloride: 106 mEq/L (ref 96–112)
Creatinine, Ser: 0.62 mg/dL (ref 0.50–1.10)
Potassium: 4 mEq/L (ref 3.5–5.1)

## 2011-01-20 LAB — HEPATIC FUNCTION PANEL
AST: 34 U/L (ref 0–37)
Albumin: 3.7 g/dL (ref 3.5–5.2)
Alkaline Phosphatase: 72 U/L (ref 39–117)
Bilirubin, Direct: 0.1 mg/dL (ref 0.0–0.3)
Total Bilirubin: 0.2 mg/dL — ABNORMAL LOW (ref 0.3–1.2)

## 2011-01-20 LAB — URINE CULTURE
Colony Count: NO GROWTH
Culture  Setup Time: 201209031242
Culture: NO GROWTH

## 2011-01-20 NOTE — Discharge Summary (Signed)
[name], [name]        ACCOUNT NO.:  [id number]  MEDICAL RECORD NO.:  [id number]  LOCATION:  1304                         FACILITY:  [facility]  PHYSICIAN:  Conley Canal, MD      DATE OF BIRTH:  Apr 12, 1981  DATE OF ADMISSION:  01/18/2011 DATE OF DISCHARGE:  01/20/2011                        DISCHARGE SUMMARY - REFERRING   DISCHARGE DIAGNOSES: 1. Acute nausea, vomiting, diarrhea, resulting in dehydration most     likely secondary to narcotic withdrawal. 2. Narcotic dependence. 3. Recurrent Clostridium difficile colitis. 4. History of pancreatitis. 5. Hepatitis C. 6. Depression.  DISCHARGE MEDICATIONS: 1. Cholestyramine 4 g daily. 2. Dilaudid 4 mg 3 times a day as needed, 20 tablets given to the     patient, can go to pain management clinic. 3. Flagyl 500 mg 3 times daily, to complete 15-day course. 4. Klonopin 2 mg 4 times daily, 20 tablets given until the patient can     be seen at pain management clinic. 5. Neurontin 900 mg 3 times daily. 6. Ortho Tri-Cyclen 1 tablet daily. 7. Promethazine 25 mg every 6 hours as needed. 8. Sertraline 100 mg q.h.s. 9. Trazodone 200 mg q.h.s.  PROCEDURES PERFORMED: 1. MRCP, September 3rd, showed status post cholecystectomy.  No     biliary dilatation or common bile duct stones and normal appearance     of the solid abdominal organs. 2. Chest x-ray, September 2nd, showed no evidence of active pulmonary     disease.  HOSPITAL COURSE:  [name] is a 30 year old female admitted on September 2nd with complaints of generalized pains, nausea, vomiting, dysuria, back pain, and diarrhea.  She had just been discharged from the hospital on August 31st for recurrent Clostridium difficile colitis.  At the time of admission this time around, she was found to be dehydrated with a BUN of 14 and creatinine 0.89.  WBC 10.7, hemoglobin 18.9, and hematocrit 52.3, pointing to hemoconcentration.  Lipase was normal. Urine drug screen was  positive for opiates.  Urinalysis suggested possible urinary tract infection with small leukocytes; however, urine culture was eventually negative.  She also had some transaminitis with an AST of 55, ALT 123, and total protein 9.1.  An HIV test was negative. After the patient was given IV fluids, the transaminases improved to an AST of 34 and ALT 65.  Because of recurrent upper abdominal symptoms, an MRCP was performed, which was essentially unremarkable.  An HIV test was also negative.  It seems like the patient had narcotic withdrawal resulting in nausea, vomiting, and abdominal pain.  She apparently did not fill her pain medication prescriptions or something happened to them per her claim that her son possibly dumped the medications in the toilet.  It seems like this narcotic dependence will be a recurring issue and she has hence been referred in the past to the [facility] and she has an appointment on September 8th.  I had an extensive discussion with the patient regarding the need to have this pain better managed and the pain management clinic may be the best place for this to consider things like methadone versus weaning her off these narcotics and benzodiazepines, given risk for other problems including death and respiratory distress, which  she seems to understand very well.  She promised to keep the appointment on the 8th.  To avoid withdrawal prior to the pain management visit, I gave her a 1-week supply of Dilaudid and clonazepam.  She said she has supplies for the other medications.  The time spent for this discharge preparation is less than 30 minutes.     Conley Canal, MD     SR/MEDQ  D:  01/20/2011  T:  01/20/2011  Job:  [id number]  cc:   [facility].  Electronically Signed by Conley Canal  on 01/20/2011 08:47:12 PM

## 2011-01-20 NOTE — H&P (Signed)
[name], [name]        ACCOUNT NO.:  [id number]  MEDICAL RECORD NO.:  [id number]  LOCATION:  [facility]                         FACILITY:  [facility]  PHYSICIAN:  Conley Canal, MD      DATE OF BIRTH:  1981-01-02  DATE OF ADMISSION:  01/18/2011 DATE OF DISCHARGE:                             HISTORY & PHYSICAL   PRIMARY CARE PHYSICIAN:  None.  CHIEF COMPLAINT:  Generalized pain, nausea, vomiting, dysuria, and back pain x2 days.  HISTORY OF PRESENT ILLNESS:  [name] is a pleasant 30 year old female who unfortunately has narcotic dependence since she had a son about 4 years ago who has had admissions for recurrent Clostridium difficile colitis, the last one being last week and discharged on August 31st, who comes in with complaints of not being able to keep anything down, low back pain, dysuria, and feeling dehydrated as well as generalized body pains.  She mentions that her son threw her pain medications into the toilet accidentally and she lost her pain medications 2 days ago.  She is coming in today with complaints of nausea, vomiting, and diarrhea.  Otherwise, she denies any cough. Denies fever, but has had some chills.  She states that she continued to take her Flagyl day after discharge, but this was one of the medications that ended up in the toilet on Saturday morning, so she has not taken any since.  In the emergency room, the patient was found to have evidence of urinary tract infection with small leukocytes on urinalysis and leukocytosis of 10.7 as well as evidence of hemoconcentration with a hemoglobin of 18.9, hematocrit 52.3 as well as some worsening transaminitis in the setting of hepatitis C.  AST was 55, ALT 123, total protein 9.1, and albumin 5.  She was referred for admission as she said she could not keep food down.  PAST MEDICAL HISTORY: 1. Narcotic dependence. 2. Recurrent Clostridium difficile colitis being treated. 3. History of  pancreatitis. 4. Hepatitis C. 5. Depression.  HOME MEDICATIONS:  Cholestyramine, Flagyl, Dilaudid, Klonopin, Neurontin, Norco, Ortho-Tri-Cyclen, promethazine, sertraline, and trazodone.  ALLERGIES: 1. SULFA. 2. BACTRIM. 3. DARVOCET.  SOCIAL HISTORY:  The patient lives with her son by herself, smokes cigarettes.  Denies alcohol.  Denies illicit drugs.  FAMILY HISTORY:  Positive for mother with multiple medical problems including Parkinson disease.  PHYSICAL EXAMINATION:  GENERAL:  On examination, this is a young lady who seems to be in some discomfort. VITAL SIGNS:  Blood pressure 130 systolic and heart rate around 100. She is febrile, oxygenating adequately, and respiratory rate 18. HEAD, EARS, EYES, NOSE, AND THROAT:  Dry oral mucosa.  Pupils equal and reacting to light. NECK:  No jugular venous distention.  No carotid bruit. RESPIRATORY SYSTEM:  Good air entry bilaterally with no rhonchi, rales, or wheezes. CARDIOVASCULAR SYSTEM:  First and second heart sounds heard.  No murmurs.  Pulse regular. ABDOMEN:  Scaphoid, soft, and nontender.  No palpable organomegaly. Bowel sounds are normal.  The patient has some tattoos. CNS:  The patient is alert and oriented to person, place, and time with no acute focal neurological deficits. EXTREMITIES:  No pedal edema.  Peripheral pulses equal.  LABORATORY DATA:  WBC 10.7, hemoglobin 18.9, hematocrit 52.3,  and platelet count 241.  Sodium 135, potassium 3.7, BUN 14, creatinine 0.89, calcium 10.6, bicarbonate 17, total bilirubin 0.6, alkaline phosphatase 106, AST 55, ALT 123, total protein 9.1, and lipase 32.  Pregnancy test negative.  No imaging studies.  IMPRESSION:  This is a 30 year old female who has a history of narcotic dependence, recurrent Clostridium difficile colitis due to medical noncompliance, who was recently treated for Clostridium difficile colitis, who comes back with complaints of nausea, vomiting, diarrhea, back  pain, and dysuria.  She likely has an element of narcotic withdrawal as well as urinary tract infection, poor oral intake has resulted in dehydration manifested by elevated transaminases, hypercalcemia, some element of metabolic acidosis.  I have discussed her narcotic dependence extensively.  At this point, it seems an admission is warranted to address the dehydration and also treat the urinary tract infection, but I have made it clear to the patient that the focus in this admission will be on treatment of the dehydration and urinary tract infection.  We will plan to continue her home medications and it is very important that she follow up with her primary care provider regarding narcotic dependence.  She is a young lady that she would not be narcotic dependent without an obvious cause.  PLAN: 1. Dehydration, UTI within inability to take oral medications.  We     will admit the patient for rehydration and she will be placed on     ciprofloxacin.  We will follow urine culture results. 2. Narcotic dependence.  We will continue her home medications and     defer adjustments of these medications to her primary care provider     once discharged. 3. Hepatitis C with slight bump in transaminases.  Expect     transaminases to be back at baseline once the patient is well     rehydrated, but we will monitor the transaminases in house.  The     patient is status post cholecystectomy. 4. DVT and GI prophylaxis.  The patient's condition is guarded.     Conley Canal, MD    SR/MEDQ  D:  01/18/2011  T:  01/18/2011  Job:  [id number]  cc:   [facility].  Electronically Signed by Conley Canal  on 01/20/2011 08:47:09 PM

## 2011-01-27 ENCOUNTER — Encounter (HOSPITAL_BASED_OUTPATIENT_CLINIC_OR_DEPARTMENT_OTHER): Payer: Self-pay

## 2011-01-27 ENCOUNTER — Emergency Department (INDEPENDENT_AMBULATORY_CARE_PROVIDER_SITE_OTHER): Payer: Self-pay

## 2011-01-27 ENCOUNTER — Emergency Department (HOSPITAL_BASED_OUTPATIENT_CLINIC_OR_DEPARTMENT_OTHER)
Admission: EM | Admit: 2011-01-27 | Discharge: 2011-01-27 | Disposition: A | Discharge status: Home / Self Care | Payer: Self-pay | Attending: Emergency Medicine | Admitting: Emergency Medicine

## 2011-01-27 DIAGNOSIS — R197 Diarrhea, unspecified: Secondary | ICD-10-CM | POA: Insufficient documentation

## 2011-01-27 DIAGNOSIS — R110 Nausea: Secondary | ICD-10-CM

## 2011-01-27 DIAGNOSIS — N12 Tubulo-interstitial nephritis, not specified as acute or chronic: Secondary | ICD-10-CM | POA: Insufficient documentation

## 2011-01-27 DIAGNOSIS — R1084 Generalized abdominal pain: Secondary | ICD-10-CM | POA: Insufficient documentation

## 2011-01-27 DIAGNOSIS — G8929 Other chronic pain: Secondary | ICD-10-CM

## 2011-01-27 DIAGNOSIS — R109 Unspecified abdominal pain: Secondary | ICD-10-CM

## 2011-01-27 DIAGNOSIS — R1110 Vomiting, unspecified: Secondary | ICD-10-CM | POA: Insufficient documentation

## 2011-01-27 LAB — URINE MICROSCOPIC-ADD ON

## 2011-01-27 LAB — URINALYSIS, ROUTINE W REFLEX MICROSCOPIC
Ketones, ur: 15 mg/dL — AB
Nitrite: POSITIVE — AB
Protein, ur: 30 mg/dL — AB
Urobilinogen, UA: 0.2 mg/dL (ref 0.0–1.0)
pH: 6.5 (ref 5.0–8.0)

## 2011-01-27 LAB — COMPREHENSIVE METABOLIC PANEL
Albumin: 4.5 g/dL (ref 3.5–5.2)
Alkaline Phosphatase: 79 U/L (ref 39–117)
BUN: 11 mg/dL (ref 6–23)
Calcium: 10 mg/dL (ref 8.4–10.5)
Creatinine, Ser: 0.6 mg/dL (ref 0.50–1.10)
GFR calc Af Amer: >60 mL/min (ref >60)
Glucose, Bld: 109 mg/dL — ABNORMAL HIGH (ref 70–99)
Total Protein: 7.9 g/dL (ref 6.0–8.3)

## 2011-01-27 LAB — DIFFERENTIAL
Basophils Absolute: 0 10*3/uL (ref 0.0–0.1)
Basophils Relative: 0 % (ref 0–1)
Eosinophils Absolute: 0.3 10*3/uL (ref 0.0–0.7)
Eosinophils Relative: 3 % (ref 0–5)
Lymphs Abs: 1.8 10*3/uL (ref 0.7–4.0)
Neutrophils Relative %: 71 % (ref 43–77)

## 2011-01-27 LAB — CBC
MCH: 31.4 pg (ref 26.0–34.0)
Platelets: 179 10*3/uL (ref 150–400)
RBC: 5.06 MIL/uL (ref 3.87–5.11)
RDW: 13.5 % (ref 11.5–15.5)

## 2011-01-27 LAB — LIPASE, BLOOD: Lipase: 22 U/L (ref 11–59)

## 2011-01-27 MED ORDER — DEXTROSE 5 % IV SOLN
1.0000 g | Freq: Once | INTRAVENOUS | Status: AC
  Administered 2011-01-27: 1 g via INTRAVENOUS
  Filled 2011-01-27: qty 1

## 2011-01-27 MED ORDER — HYDROMORPHONE HCL 1 MG/ML IJ SOLN
1.0000 mg | Freq: Once | INTRAMUSCULAR | Status: AC
  Administered 2011-01-27: 1 mg via INTRAVENOUS
  Filled 2011-01-27: qty 1

## 2011-01-27 MED ORDER — SODIUM CHLORIDE 0.9 % IV BOLUS (SEPSIS)
1000.0000 mL | Freq: Once | INTRAVENOUS | Status: AC
  Administered 2011-01-27: 1000 mL via INTRAVENOUS

## 2011-01-27 MED ORDER — LEVOFLOXACIN 750 MG PO TABS: 750.0000 mg | ORAL_TABLET | Freq: Every day | ORAL | Status: AC

## 2011-01-27 MED ORDER — IBUPROFEN 800 MG PO TABS: 800.0000 mg | ORAL_TABLET | Freq: Three times a day (TID) | ORAL | Status: AC

## 2011-01-27 MED ORDER — KETOROLAC TROMETHAMINE 30 MG/ML IJ SOLN
30.0000 mg | Freq: Once | INTRAMUSCULAR | Status: AC
  Administered 2011-01-27: 30 mg via INTRAVENOUS
  Filled 2011-01-27: qty 1

## 2011-01-27 MED ORDER — ONDANSETRON HCL 4 MG/2ML IJ SOLN
4.0000 mg | Freq: Once | INTRAMUSCULAR | Status: AC
  Administered 2011-01-27: 4 mg via INTRAVENOUS
  Filled 2011-01-27: qty 2

## 2011-01-27 NOTE — ED Notes (Signed)
Pt reports abdominal pain, nausea, vomiting and diarrhea.  She is unable to hold any PO intake down.  This is a chronic problem since 2008.

## 2011-01-27 NOTE — ED Provider Notes (Signed)
History     CSN: [id number] Arrival date & time: 01/27/2011  4:40 PM  Chief Complaint  Patient presents with  . Abdominal Pain  . Diarrhea  . Emesis   HPI Comments: 30 year old female history of pancreatitis, history of C. difficile colitis presents with abdominal pain nausea vomiting diarrhea times one to one and a half weeks.  Patient reports that this is the same abdominal pain that she has been experiencing over the last few months. But now returned. Describes diffuse crampy abdominal pain with multiple episodes of nonbloody nonbilious emesis and multiple episodes of diarrhea (nonbloody).  The pain does radiate to her back. She denies any vaginal discharge history of sexually transmitted infections. Denies hematuria dysuria frequency and urinary urgency. Denies fever and chills. States that she has completed her course of Flagyl for C. difficile. States that she was taking dilaudid at home for the pain with some relief of symptoms before she ran out. Patient has had multiple admissions within the last 2 months for the same problem. Has been evaluated by GI specialist and there is thought to be some component of narcotic dependence.  Patient is scheduled to followup with GI at [facility] as well as pain clinic at [facility] but has not completed an appointment with either yet.  Patient is a 30 y.o. female presenting with abdominal pain, diarrhea, and vomiting.  Abdominal Pain The primary symptoms of the illness include abdominal pain, vomiting and diarrhea.  Diarrhea The primary symptoms include abdominal pain, vomiting and diarrhea.  Emesis  Associated symptoms include abdominal pain and diarrhea.    Past Medical History  Diagnosis Date  . Asthma   . Pancreatitis   . Colitis   . Hepatitis C     Past Surgical History  Procedure Date  . Cholecystectomy   . Appendectomy   . Leg surgery   . Nasal sugery     No family history on file.  History    Substance Use Topics  . Smoking status: Current Everyday Smoker  . Smokeless tobacco: Not on file  . Alcohol Use: No     former drinker    OB History    Grav Para Term Preterm Abortions TAB SAB Ect Mult Living                  Review of Systems  Gastrointestinal: Positive for vomiting, abdominal pain and diarrhea.  All other systems reviewed and are negative.   except as noted in history of present illness  Physical Exam  BP 108/72  Pulse 71  Temp(Src) 98.3 F (36.8 C) (Oral)  Resp 16  Ht 5\' 3"  (1.6 m)  Wt 125 lb (56.7 kg)  BMI 22.14 kg/m2  SpO2 98%  LMP 01/17/2011  Physical Exam  Nursing note and vitals reviewed. Constitutional: She is oriented to person, place, and time. She appears well-developed.  HENT:  Head: Atraumatic.  Mouth/Throat: Oropharynx is clear and moist.  Eyes: Conjunctivae and EOM are normal. Pupils are equal, round, and reactive to light.  Neck: Normal range of motion. Neck supple.  Cardiovascular: Normal rate, regular rhythm, normal heart sounds and intact distal pulses.   Pulmonary/Chest: Effort normal and breath sounds normal. No respiratory distress. She has no wheezes. She has no rales.  Abdominal: Soft. She exhibits no distension. There is tenderness. There is no rebound and no guarding.       Diffuse abdominal tenderness to palpation no rebounding no guarding no mass  Positive  bilateral CVA tenderness  Musculoskeletal: Normal range of motion.  Neurological: She is alert and oriented to person, place, and time.  Skin: Skin is warm and dry. No rash noted.  Psychiatric: She has a normal mood and affect.   Results for orders placed during the hospital encounter of 01/18/11  POCT PREGNANCY, URINE      Component Value Range   Preg Test, Ur NEGATIVE    URINALYSIS, ROUTINE W REFLEX MICROSCOPIC      Component Value Range   Color, Urine AMBER (*) YELLOW    Appearance TURBID (*) CLEAR    Specific Gravity, Urine 1.026  1.005 - 1.030    pH 6.0   5.0 - 8.0    Glucose, UA NEGATIVE  NEGATIVE (mg/dL)   Hgb urine dipstick MODERATE (*) NEGATIVE    Bilirubin Urine SMALL (*) NEGATIVE    Ketones, ur TRACE (*) NEGATIVE (mg/dL)   Protein, ur 30 (*) NEGATIVE (mg/dL)   Urobilinogen, UA 0.2  0.0 - 1.0 (mg/dL)   Nitrite NEGATIVE  NEGATIVE    Leukocytes, UA SMALL (*) NEGATIVE   URINE MICROSCOPIC-ADD ON      Component Value Range   Squamous Epithelial / LPF MANY (*) RARE    WBC, UA 0-2  <3 (WBC/hpf)   RBC / HPF 0-2  <3 (RBC/hpf)   Bacteria, UA MANY (*) RARE    Casts HYALINE CASTS (*) NEGATIVE    Urine-Other MUCOUS PRESENT    DIFFERENTIAL      Component Value Range   Neutrophils Relative 66  43 - 77 (%)   Neutro Abs 7.1  1.7 - 7.7 (K/uL)   Lymphocytes Relative 23  12 - 46 (%)   Lymphs Abs 2.5  0.7 - 4.0 (K/uL)   Monocytes Relative 9  3 - 12 (%)   Monocytes Absolute 1.0  0.1 - 1.0 (K/uL)   Eosinophils Relative 2  0 - 5 (%)   Eosinophils Absolute 0.2  0.0 - 0.7 (K/uL)   Basophils Relative 0  0 - 1 (%)   Basophils Absolute 0.0  0.0 - 0.1 (K/uL)  CBC      Component Value Range   WBC 10.7 (*) 4.0 - 10.5 (K/uL)   RBC 5.81 (*) 3.87 - 5.11 (MIL/uL)   Hemoglobin 18.9 (*) 12.0 - 15.0 (g/dL)   HCT 16.1 (*) 09.6 - 46.0 (%)   MCV 90.0  78.0 - 100.0 (fL)   MCH 32.5  26.0 - 34.0 (pg)   MCHC 36.1 (*) 30.0 - 36.0 (g/dL)   RDW 04.5  40.9 - 81.1 (%)   Platelets 241  150 - 400 (K/uL)  COMPREHENSIVE METABOLIC PANEL      Component Value Range   Sodium 135  135 - 145 (mEq/L)   Potassium 3.7  3.5 - 5.1 (mEq/L)   Chloride 100  96 - 112 (mEq/L)   CO2 17 (*) 19 - 32 (mEq/L)   Glucose, Bld 116 (*) 70 - 99 (mg/dL)   BUN 14  6 - 23 (mg/dL)   Creatinine, Ser 9.14  0.50 - 1.10 (mg/dL)   Calcium 78.2 (*) 8.4 - 10.5 (mg/dL)   Total Protein 9.1 (*) 6.0 - 8.3 (g/dL)   Albumin 5.0  3.5 - 5.2 (g/dL)   AST 55 (*) 0 - 37 (U/L)   ALT 123 (*) 0 - 35 (U/L)   Alkaline Phosphatase 106  39 - 117 (U/L)   Total Bilirubin 0.6  0.3 - 1.2 (mg/dL)   GFR calc non Af  Amer  >60  >60 (mL/min)   GFR calc Af Amer >60  >60 (mL/min)  LIPASE, BLOOD      Component Value Range   Lipase 32  11 - 59 (U/L)  HIV ANTIBODY      Component Value Range   HIV NON REACTIVE  NON REACTIVE   URINE RAPID DRUG SCREEN (HOSP PERFORMED)      Component Value Range   Opiates POSITIVE (*) NONE DETECTED    Cocaine NONE DETECTED  NONE DETECTED    Benzodiazepines NONE DETECTED  NONE DETECTED    Amphetamines NONE DETECTED  NONE DETECTED    Tetrahydrocannabinol NONE DETECTED  NONE DETECTED    Barbiturates NONE DETECTED  NONE DETECTED   CBC      Component Value Range   WBC 6.1  4.0 - 10.5 (K/uL)   RBC 4.22  3.87 - 5.11 (MIL/uL)   Hemoglobin 13.2  12.0 - 15.0 (g/dL)   HCT 40.9  81.1 - 91.4 (%)   MCV 92.2  78.0 - 100.0 (fL)   MCH 31.0  26.0 - 34.0 (pg)   MCHC 33.7  30.0 - 36.0 (g/dL)   RDW 78.2  95.6 - 21.3 (%)   Platelets 179  150 - 400 (K/uL)  COMPREHENSIVE METABOLIC PANEL      Component Value Range   Sodium 141  135 - 145 (mEq/L)   Potassium 4.2  3.5 - 5.1 (mEq/L)   Chloride 108  96 - 112 (mEq/L)   CO2 26  19 - 32 (mEq/L)   Glucose, Bld 107 (*) 70 - 99 (mg/dL)   BUN 13  6 - 23 (mg/dL)   Creatinine, Ser 0.86  0.50 - 1.10 (mg/dL)   Calcium 8.9  8.4 - 57.8 (mg/dL)   Total Protein 6.2  6.0 - 8.3 (g/dL)   Albumin 3.3 (*) 3.5 - 5.2 (g/dL)   AST 29  0 - 37 (U/L)   ALT 68 (*) 0 - 35 (U/L)   Alkaline Phosphatase 67  39 - 117 (U/L)   Total Bilirubin 0.5  0.3 - 1.2 (mg/dL)   GFR calc non Af Amer >60  >60 (mL/min)   GFR calc Af Amer >60  >60 (mL/min)  PROTIME-INR      Component Value Range   Prothrombin Time 13.5  11.6 - 15.2 (seconds)   INR 1.01  0.00 - 1.49   HEPATIC FUNCTION PANEL      Component Value Range   Total Protein 6.9  6.0 - 8.3 (g/dL)   Albumin 3.7  3.5 - 5.2 (g/dL)   AST 34  0 - 37 (U/L)   ALT 65 (*) 0 - 35 (U/L)   Alkaline Phosphatase 72  39 - 117 (U/L)   Total Bilirubin 0.2 (*) 0.3 - 1.2 (mg/dL)   Bilirubin, Direct 0.1  0.0 - 0.3 (mg/dL)   Indirect Bilirubin  0.1 (*) 0.3 - 0.9 (mg/dL)  BASIC METABOLIC PANEL      Component Value Range   Sodium 141  135 - 145 (mEq/L)   Potassium 4.0  3.5 - 5.1 (mEq/L)   Chloride 106  96 - 112 (mEq/L)   CO2 26  19 - 32 (mEq/L)   Glucose, Bld 71  70 - 99 (mg/dL)   BUN 6  6 - 23 (mg/dL)   Creatinine, Ser 4.69  0.50 - 1.10 (mg/dL)   Calcium 8.9  8.4 - 62.9 (mg/dL)   GFR calc non Af Amer >60  >60 (mL/min)   GFR calc Af  Amer >60  >60 (mL/min)  URINE CULTURE      Component Value Range   Specimen Description URINE, CLEAN CATCH     Special Requests IMMUNE:NORM UT SYMPT:NEG     Setup Time [id number]     Colony Count NO GROWTH     Culture NO GROWTH     Report Status 01/20/2011 FINAL     US Transvaginal Non-ob  01/12/2011  *RADIOLOGY REPORT*  Clinical Data: Pelvic pain.  LMP 12/18/2010.  TRANSABDOMINAL AND TRANSVAGINAL ULTRASOUND OF PELVIS  Technique:  Both transabdominal and transvaginal ultrasound examinations of the pelvis were performed.  Transabdominal technique was performed for global imaging of the pelvis including uterus, ovaries, adnexal regions, and pelvic cul-de-sac.  It was necessary to proceed with endovaginal exam following the transabdominal exam to visualize the myometrium, endometrial stripe, and ovaries in greater detail.  Comparison:  None.  Findings: Uterus:  6.1 x 3.7 x 4.0 cm.  No fibroids are identified.  Endometrium: 6 mm double layer thickness measured transvaginally. No focal lesion visualized.  Right ovary: 3.6 x 2.4 x 3.5 cm.  A complex cyst with several thin septations is seen which measures 3.2 x 1.9 cm.  No internal blood flow is seen on color Doppler ultrasound.  Left ovary: 2.3 x 1.6 x 2.5 cm. Normal appearance.  No ovarian or adnexal mass visualized.  Other Findings:  A small amount of free fluid in pelvic cul-de-sac.  IMPRESSION:  1.  3.2 cm complex right ovarian cyst with indeterminate, but probably benign characteristics.  Follow-up by pelvic ultrasound is recommended in 6-12 weeks. 2.   Normal appearance of uterus and left ovary.  Original Report Authenticated By: Danae Orleans, M.D.   US Pelvis Complete  01/12/2011  *RADIOLOGY REPORT*  Clinical Data: Pelvic pain.  LMP 12/18/2010.  TRANSABDOMINAL AND TRANSVAGINAL ULTRASOUND OF PELVIS  Technique:  Both transabdominal and transvaginal ultrasound examinations of the pelvis were performed.  Transabdominal technique was performed for global imaging of the pelvis including uterus, ovaries, adnexal regions, and pelvic cul-de-sac.  It was necessary to proceed with endovaginal exam following the transabdominal exam to visualize the myometrium, endometrial stripe, and ovaries in greater detail.  Comparison:  None.  Findings: Uterus:  6.1 x 3.7 x 4.0 cm.  No fibroids are identified.  Endometrium: 6 mm double layer thickness measured transvaginally. No focal lesion visualized.  Right ovary: 3.6 x 2.4 x 3.5 cm.  A complex cyst with several thin septations is seen which measures 3.2 x 1.9 cm.  No internal blood flow is seen on color Doppler ultrasound.  Left ovary: 2.3 x 1.6 x 2.5 cm. Normal appearance.  No ovarian or adnexal mass visualized.  Other Findings:  A small amount of free fluid in pelvic cul-de-sac.  IMPRESSION:  1.  3.2 cm complex right ovarian cyst with indeterminate, but probably benign characteristics.  Follow-up by pelvic ultrasound is recommended in 6-12 weeks. 2.  Normal appearance of uterus and left ovary.  Original Report Authenticated By: Danae Orleans, M.D.   Ct Abdomen Pelvis W Contrast  01/12/2011  *RADIOLOGY REPORT*  Clinical Data: Abdominal pain with history of pancreatitis and colitis.  CT ABDOMEN AND PELVIS WITH CONTRAST  Technique:  Multidetector CT imaging of the abdomen and pelvis was performed following the standard protocol during bolus administration of intravenous contrast.  Contrast: 100 ml Omnipaque-300.  Comparison: 12/30/2010.  Findings: Lung bases show mild dependent atelectasis.  Heart size normal.  No pericardial  or pleural effusion.  Mild intrahepatic  and extrahepatic biliary duct dilatation likely relates to cholecystectomy.  Liver, adrenal glands and kidneys are unremarkable.  Spleen measures 13 cm.  Pancreas, stomach and bowel are unremarkable.  Uterus and ovaries are visualized.  There has been interval enlargement of a low attenuation lesion in the right adnexa, now measuring 1.9 x 3.1 cm, likely representing a benign physiologic ovarian cyst.  No pathologically enlarged lymph nodes.  Small pelvic free fluid.  No worrisome lytic or sclerotic lesions.  IMPRESSION:  1.  No acute findings. 2.  Borderline splenomegaly.  Original Report Authenticated By: Reyes Ivan, M.D.   Ct Abdomen Pelvis W Contrast  12/30/2010  *RADIOLOGY REPORT*  Clinical Data: Abdominal pain.  History of pancreatitis, colitis, hepatitis C.  CT ABDOMEN AND PELVIS WITH CONTRAST  Technique:  Multidetector CT imaging of the abdomen and pelvis was performed following the standard protocol during bolus administration of intravenous contrast.  Contrast: 100 ml Omnipaque-300  Comparison: 12/09/2010  Findings: No hepatic contour nodularity or focal hepatic lesion identified.  The spleen and adrenal glands appear unremarkable.  No peripancreatic stranding is evident.  Common bile duct measures 9 mm in diameter, essentially stable, likely representing physiologic dilatation from prior cholecystectomy.  No findings of pancreas divisum.  Borderline prominent porta hepatis nodes are present.  Abnormal proximal small bowel dilatation noted, at 3.3 cm, with a fold accentuation compatible with proximal enteritis.  Mildly dilated descending duodenum noted.  Scattered air-fluid levels are present in the small bowel.  No upper abdominal ascites noted.  No periaortic adenopathy is identified.  The kidneys appear unremarkable, as do the proximal ureters.  Previously identified right adnexal cystic lesion currently measures 1.3 cm (formerly 2.4 cm).  Trace free  pelvic fluid is reduced in size compared the prior exam.  Urinary bladder appears unremarkable.  IMPRESSION:  1.  Dilated loops of proximal small bowel with accentuated folds, suggesting proximal ileitis. 2.  The pancreas appears unremarkable. 3.  Trace free pelvic fluid, reduced from the prior exam. 4.  Reduced size of right ovarian cystic lesion.  Original Report Authenticated By: Dellia Cloud, M.D.   Mr 3d Recon At Scanner  01/19/2011  *RADIOLOGY REPORT*  Clinical Data: Pancreatitis.  MRI ABDOMEN WITHOUT AND WITH CONTRAST (MRCP)  Technique:  Multiplanar multisequence MR imaging of the abdomen was performed without and with contrast, including heavily T2-weighted images of the biliary and pancreatic ducts.  Three-dimensional MR images were rendered by post processing of the original MR data.  Contrast: 12 ml Multihance.  Comparison:  CT abdomen 01/12/2011.  Findings:  Examination is limited due to respiratory motion.  The patient would not or could not hold her breath.  The liver is grossly normal.  The spleen is upper limits of normal in size.  No focal lesions.  The pancreas is grossly normal.  The adrenal glands and kidneys are normal.  The common bile duct is normal in caliber. No obvious common bile duct stones.  No pancreatic duct dilatation.  The aorta is normal in caliber.  The major branch vessels are normal.  No mesenteric or retroperitoneal masses or adenopathy. The stomach, duodenum, small bowel and visualized colon unremarkable.  There are surgical changes from a cholecystectomy.  IMPRESSION:  1.  Status post cholecystectomy.  No biliary dilatation or common bile duct stones. 2.  Normal appearance of the solid abdominal organs.  Original Report Authenticated By: P. Loralie Champagne, M.D.   Dg Chest Portable 1 View  01/18/2011  *RADIOLOGY REPORT*  Clinical  Data: Weakness.  History of asthma and smoking.  Rule out pneumonia.  PORTABLE CHEST - 1 VIEW  Comparison: 06/29/2008  Findings: The heart  size and pulmonary vascularity are normal. The lungs appear clear and expanded without focal air space disease or consolidation. No blunting of the costophrenic angles.  No significant change since previous study. Surgical clips in the right upper quadrant.  IMPRESSION: No evidence of active pulmonary disease.  Original Report Authenticated By: Marlon Pel, M.D.   Dg Abd Acute W/chest  01/11/2011  *RADIOLOGY REPORT*  Clinical Data: Abdominal pain.  History of Crohn's disease and pancreatitis.  Smoker.  ACUTE ABDOMEN SERIES (ABDOMEN 2 VIEW & CHEST 1 VIEW)  Comparison: Plain films of the abdomen 12/13/2010 and 12/30/2010. Plain films of the chest 06/29/2008.  Findings: Single view of the chest demonstrates clear lungs and normal heart size.  No pneumothorax or pleural effusion.  Two views of the abdomen show no free intraperitoneal air.  Bowel gas pattern is normal.  IMPRESSION: Negative exam.  Original Report Authenticated By: Bernadene Bell. Maricela Curet, M.D.   Dg Abd Portable 1v  01/05/2011  *RADIOLOGY REPORT*  Clinical Data: Severe mid abdominal pain.  ABDOMEN - 1 VIEW  Comparison: Abdominal radiograph performed 12/13/2010  Findings: The visualized bowel gas pattern is unremarkable. Scattered air and stool filled loops of colon are seen; no abnormal dilatation of small bowel loops is seen to suggest small bowel obstruction.  No free intra-abdominal air is identified, though evaluation for free air is limited on a single supine view.  Clips are noted within the right upper quadrant, reflecting prior cholecystectomy.  A clip is also noted within the right hemipelvis.  The visualized osseous structures are within normal limits; the sacroiliac joints are unremarkable in appearance.  The visualized lung bases are essentially clear.  IMPRESSION: Unremarkable bowel gas pattern; no free intra-abdominal air seen.  Original Report Authenticated By: Tonia Ghent, M.D.   Mr Abd W/wo Cm/mrcp  01/19/2011  *RADIOLOGY  REPORT*  Clinical Data: Pancreatitis.  MRI ABDOMEN WITHOUT AND WITH CONTRAST (MRCP)  Technique:  Multiplanar multisequence MR imaging of the abdomen was performed without and with contrast, including heavily T2-weighted images of the biliary and pancreatic ducts.  Three-dimensional MR images were rendered by post processing of the original MR data.  Contrast: 12 ml Multihance.  Comparison:  CT abdomen 01/12/2011.  Findings:  Examination is limited due to respiratory motion.  The patient would not or could not hold her breath.  The liver is grossly normal.  The spleen is upper limits of normal in size.  No focal lesions.  The pancreas is grossly normal.  The adrenal glands and kidneys are normal.  The common bile duct is normal in caliber. No obvious common bile duct stones.  No pancreatic duct dilatation.  The aorta is normal in caliber.  The major branch vessels are normal.  No mesenteric or retroperitoneal masses or adenopathy. The stomach, duodenum, small bowel and visualized colon unremarkable.  There are surgical changes from a cholecystectomy.  IMPRESSION:  1.  Status post cholecystectomy.  No biliary dilatation or common bile duct stones. 2.  Normal appearance of the solid abdominal organs.  Original Report Authenticated By: P. Loralie Champagne, M.D.   *RADIOLOGY REPORT*  Clinical Data: Abdominal pain radiating to bilateral flank areas.  Nausea. Diarrhea. Cholecystectomy. Appendectomy.  ACUTE ABDOMEN SERIES (ABDOMEN 2 VIEW & CHEST 1 VIEW)  Comparison: MRI 01/19/2011. CT of 01/12/2011. Plain film chest  01/18/2011.  Findings: Frontal view  of the chest demonstrates midline trachea.  Normal heart size and mediastinal contours. No pleural effusion or  pneumothorax. Clear lungs.  Abominal films demonstrate cholecystectomy clips. No free  intraperitoneal air. Single air fluid level within the left side  of the abdomen.  No bowel distention. Distal gas. Surgical clip in the right  pelvis.  No abnormal  abdominal calcifications. No appendicolith.  IMPRESSION:  1. No acute cardiopulmonary disease.  2. Single left abdominal air fluid level within nondilated small  bowel. Nonspecific. No evidence of free intraperitoneal air.   ED Course  Procedures  MDM 30 year old female with recent C. difficile colitis history of pancreatitis presents with nausea vomiting diarrhea and abdominal pain, consistent with previous symptoms over the last few months. She has completed antibiotics for her C. difficile. She denies any bloody stools. She has had extensive workups in past for her abdominal pain including multiple x-rays multiple CT scans of her abdomen, and recent MRCP completed 8 days ago which was negative (see report above). She has been seen by GI specialists and there is thought to be some narcotic dependence and opioid abuse.  Will order x-ray to evaluate for distended colon. Discussed with patient consent for narcotic abuse vs withdrawal.  Will give one dose of Dilaudid here along with anti-emetics and IV fluids. Will treat further pain with Toradol. And reassess. Anticipate discharge home with GI and pain clinic followup.  Stefano Gaul, MD  6:03 PM  labs reviewed. Remarkable for urinary tract infection. Possibly pyelonephritis given bilateral CVA tenderness.  Ceftriaxone ordered. Patient continues to complain of pain despite 1 mg of Dilaudid. Will order toradol. Reassess.  7:14 PM  patient continues to complain of severe abdominal pain. And nausea. She has not vomited here. Patient has become very upset when told that she will not get any more pain medication  7:37 PM  Discussed admit with [facility] hospitalist Dr. Orlinda Blalock and Thea Alken at [facility]. Was in the ER 3 days ago with neg c diff screen and CT A/P wo contrast which was normal, labs were normal including sed rates. Pt did not divulge this information to me previously. Recent EGD negative per GI and no other abnormalities per GI at [facility].  They do feel that she has had a very comprehensive work up recently. Is to be seen in medicine clinic [facility]. Will discharge home with outpatient follow up. Pt given precautions for return. Abx. Will discharge home with her PMD f/u.   Stefano Gaul, MD      Forbes Cellar, MD 01/27/11 [phone]

## 2011-01-27 NOTE — ED Notes (Signed)
Pt sts she is out of her pain medication and Klonopin and cannot get them refilled "for a while".

## 2011-01-29 LAB — URINE CULTURE: Colony Count: 40000

## 2011-01-31 NOTE — ED Notes (Signed)
+   urine culture. Tx'd with Levaquin, sensitive to same per protocol MD.

## 2011-02-03 NOTE — H&P (Signed)
NAME:  [name], [name]        ACCOUNT NO.:  [id number]  MEDICAL RECORD NO.:  [id number]  LOCATION:                                 FACILITY:  PHYSICIAN:  Cayetano Mikita, DO         DATE OF BIRTH:  1980-08-18  DATE OF ADMISSION: DATE OF DISCHARGE:                             HISTORY & PHYSICAL   CHIEF COMPLAINT:  Nausea, abdominal pain.  HISTORY OF PRESENT ILLNESS:  The patient is a 30 year old female with a history of Crohn disease and pancreatitis.  The patient states the last Saturday she awoke feeling nauseated and with abdominal pain.  The abdominal pain is periumbilical, and she states it radiates around to her back.  She states that she is able to keep food down but she simply has no appetite.  She states anything she eats she rapidly has a bowel movement afterwards.  She denies fevers but she states she has had chills.  PAST MEDICAL HISTORY:  Significant for: 1. Crohn disease. 2. Pancreatitis. 3. Hepatitis C. 4. Asthma. 5. Migraine headaches. 6. History of alcohol and drug abuse. 7. Somatization disorder. 8. Anxiety, depression. 9. Bipolar disorder.  PAST SURGICAL HISTORY: 1. Appendectomy. 2. Cholecystectomy.  MEDICATIONS AT HOME:  The patient knows what medications she takes at home but not the dosages.  She takes Dilaudid p.o., Neurontin, trazodone, Zoloft, Klonopin, and Ortho-Cyclen.  ALLERGIES:  SULFA.  REVIEW OF SYSTEMS:  CONSTITUTIONAL:  Negative for fever, positive for chills.  Positive for weakness.  Positive for fatigue.  CENTRAL NERVOUS SYSTEM:  No headaches, no seizures, no limb weakness.  ENT:  No nasal congestion.  No throat pain.  No coryza.  CARDIOVASCULAR:  No chest pain, no palpitations, no orthopnea.  RESPIRATORY:  No cough, no shortness of breath.  No wheezing.  GASTROINTESTINAL:  Positive for nausea, positive for vomiting.  Negative for constipation.  Positive for diarrhea.  Positive for abdominal pain.  GENITOURINARY:  No dysuria,  no hematuria, no urinary frequency.  RENAL:  No flank pain.  No swelling. No pruritus.  SKIN:  No rashes, ulcers, or lesions.  HEMATOLOGICAL:  No easy bruising, no purpura, no clots.  LYMPHATIC:  No lymphadenopathy. No painful nodes or no specific lymph swelling.  PSYCHIATRIC:  No anxiety.  No depression.  No insomnia.  FAMILY HISTORY:  Positive for hypertension, arthritis, and lung cancer.  PHYSICAL EXAMINATION:  VITAL SIGNS:  Heart rate 82, respiratory rate 18, blood pressure 120/74, O2 sat 99% on room air. GENERAL:  The patient is awake, alert, and ordered x3, in no acute distress. HEENT:  Eyes:  Pupils equal, round, and reactive to light and accommodation.  External ocular movements bilaterally intact.  Sclerae nonicteric, noninjected.  Mouth:  Oral mucosa moist.  No lesions.  No sores.  Pharynx clean.  No erythema.  No exudate. NECK:  Negative for JVD.  Negative thyromegaly.  Negative for lymphadenopathy. HEART:  Regular rate and rhythm at 80 beats per minute without murmur, ectopy, or gallops.  No lateral PMI.  No thrills. LUNGS:  Clear to auscultation bilaterally without wheezes, rales, rhonchi.  No increased work of breathing.  No tactile fremitus. ABDOMEN:  Diffusely tender, especially tender in the epigastrium and upper  gastric area.  Positive for rebound, positive for regarding. Positive bowel sounds.  Unable to evaluate for hepatosplenomegaly due to tenderness. CARDIOVASCULAR:  Extremities are negative for cyanosis, clubbing, or edema.  The patient has positive for dorsalis pedis and popliteal pulses bilaterally.  No carotid bruits bilaterally. NEUROLOGIC:  Cranial nerves II through XII grossly intact.  Motor and sensory intact.  The patient is moving all extremities.  SOCIAL HISTORY:  The patient smokes.  She is a former alcohol and drug abuser.  LABORATORY DATA:  WBC is 9.1, hemoglobin 16.9, hematocrit 46.6, platelets 146.  Sodium 137, potassium 4.4, chloride 101,  CO2 is 26, BUN 12, creatinine 0.69, T bili 0.5, AST 28, ALT 31, alk phos 68, lipase 22, total protein 8.2, albumin 4.5, calcium 10.  CT of the abdomen and pelvis demonstrates mild colitis.  ASSESSMENT: 1. Acute colitis. 2. Abdominal pain. 3. Nausea. 4. Headache.  PLAN: 1. IV antibiotics. 2. IV fluids. 3. Clear liquid diet. 4. Pain control. 5. Await pharmacy's completion of the patient's MedRec form and     consult GI as the patient has a protracted course.  The patient does not have a PCP.          ______________________________ Fran Lowes, DO     AS/MEDQ  D:  12/09/2010  T:  12/10/2010  Job:  [id number]  Electronically Signed by Fran Lowes DO on 02/03/2011 10:31:29 PM

## 2011-02-03 NOTE — Discharge Summary (Signed)
NAMEGARYN, WAGUESPACK        ACCOUNT NO.:  [id number]  MEDICAL RECORD NO.:  [id number]  LOCATION:  5001                         FACILITY:  [facility]  PHYSICIAN:  Seva Chancy, DO         DATE OF BIRTH:  1981-04-30  DATE OF ADMISSION:  12/30/2010 DATE OF DISCHARGE:  01/07/2011                              DISCHARGE SUMMARY   ADMISSION DIAGNOSES: 1. Acute-on-chronic abdominal pain. 2. Chronic hepatitis C. 3. Crohn's disease.  Bipolar disorder. 4. Anxiety. 5. Asthma.  HISTORY OF PRESENT ILLNESS:  Please see H and P.  HOSPITAL COURSE:  The patient was admitted.  She was given pain control, IV Cipro and Flagyl.  She was continued on her home pain medications and she was given nicotine patch, trazodone and sertraline.  The patient was given tobacco cessation counseling.  Dr. Dwain Sarna was consulted for GI who felt the patient had proximal enteritis.  She also had recently had C  diff and stools were sent for C diff, hepatitis C, chronic abdominal pain, chronic pancreatitis.  Surgery was also consulted and they saw no need for surgery.  There is no indication of a surgical cause of the patient's pain.  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  The patient's narcotics have been limited and we have reduced her access to IV Dilaudid.  We have given her a fentanyl patch to use in conjunction with her regular Dilaudid that she uses at home.  Although the patient continues to complain of excruciating pain and diarrhea, I have from nursing the patient has had absolutely no stools and last night at 11 o'clock, the patient had a sandwich, chips and a drink of which she ate 100%.  She has also being given graham crackers and peanut butter which she has eaten 100%.  She had 100% of her breakfast this morning with double grits. After she did all this, nurses asked her how she was doing and she said "I do not know, I do not feel very well.  I think I might  need one of those tubes down my nose." The patient's C diff appears to be well in control.  Case management has discussed the patient with the clinic at [facility].  They are assigning her a new physician as she was unhappy with the physician she had been assigned to there.  There is also a pain clinic at [facility] that she did go to but has not been to in a while. Today the patient will be discharged to home in good condition. Discharge instructions included activity as tolerated.  Diet is low residue.  Medications at home included cholestyramine 4 g in 8 ounces of water q.12 hours as needed for diarrhea or more than 3 loose stools in a day, she will have a 25 mcg fentanyl patch, Librax 1-2 capsules by mouth before meals and at bedtime, a nicotine patch 1 transdermal daily, the patient is to stop smoking, Phenergan 12.5 mg 1 p.o. q.6 hours as needed for nausea, vancomycin oral solution 125 mg by mouth 4 times daily, Dilaudid 4 mg 1 by mouth 3 times daily as needed for pain, Klonopin 2 mg  1 by mouth 3 times daily, Neurontin 800 mg 1 p.o. t.i.d., Norco 10/325 one tablet by mouth 3 times daily as needed for pain, sertraline 100 mg one p.o. daily, trazodone 100 mg 2 p.o. q.h.s.  The patient is to follow up at the clinic at [facility] within 1 week and she is to follow up with the pain clinic at [facility] as soon as possible.  I have asked case management to make appointments for these things.  I spent 40 minutes on this discharge.          ______________________________ Fran Lowes, DO     AS/MEDQ  D:  01/07/2011  T:  01/07/2011  Job:  [id number]  cc:   [facility]  Petra Kuba, M.D. Fax: [phone]  Dr. Colin Broach, MD [address] [place] [address]  Electronically Signed by Fran Lowes DO on 02/03/2011 10:31:34 PM

## 2011-02-03 NOTE — H&P (Signed)
  [name], [name]        ACCOUNT NO.:  [id number]  MEDICAL RECORD NO.:  [id number]  LOCATION:  3001                         FACILITY:  [facility]  PHYSICIAN:  Charlena Haub, DO         DATE OF BIRTH:  [birth year]  DATE OF ADMISSION:  12/09/2010 DATE OF DISCHARGE:                             HISTORY & PHYSICAL   ADDENDUM  Plan includes IV antibiotics, IV fluids, clear liquid diet, pain control and GI will be consulted if the patient follows a protracted course.          ______________________________ Fran Lowes, DO     AS/MEDQ  D:  12/09/2010  T:  12/10/2010  Job:  [id number]  Electronically Signed by Fran Lowes DO on 02/03/2011 10:31:26 PM

## 2011-02-05 LAB — BASIC METABOLIC PANEL
CO2: 29
CO2: 31
Calcium: 8.9
Chloride: 108
Chloride: 111
Creatinine, Ser: 0.62
GFR calc Af Amer: >60
GFR calc Af Amer: >60
GFR calc non Af Amer: >60
Potassium: 3.7
Potassium: 3.4 — ABNORMAL LOW
Sodium: 141
Sodium: 145
Sodium: 141

## 2011-02-05 LAB — DIFFERENTIAL
Basophils Absolute: 0
Lymphocytes Relative: 27
Lymphs Abs: 1.6
Monocytes Absolute: 0.5
Neutro Abs: 3.6

## 2011-02-05 LAB — CBC
HCT: 36.3
HCT: 42
Hemoglobin: 12.8
Hemoglobin: 13
MCHC: 34.8
MCHC: 34.5
MCV: 89.9
MCV: 91.2
Platelets: 147 — ABNORMAL LOW
RBC: 4.03
RBC: 4.05
RBC: 4.27
RDW: 12.6
RDW: 13.1
WBC: 4.8
WBC: 5.2
WBC: 5.8

## 2011-02-05 LAB — URINALYSIS, ROUTINE W REFLEX MICROSCOPIC
Glucose, UA: NEGATIVE
Hgb urine dipstick: NEGATIVE
pH: 7

## 2011-02-05 LAB — COMPREHENSIVE METABOLIC PANEL
Albumin: 4.1
BUN: 5 — ABNORMAL LOW
Calcium: 10
Chloride: 103
Creatinine, Ser: 0.5
Total Bilirubin: 0.7

## 2011-02-05 LAB — LIPASE, BLOOD: Lipase: 19

## 2011-02-12 LAB — CBC
HCT: 37.4
MCV: 91.8
Platelets: 124 — ABNORMAL LOW
RDW: 13.2

## 2011-02-12 LAB — DRUGS OF ABUSE SCREEN W/O ALC, ROUTINE URINE
Barbiturate Quant, Ur: NEGATIVE
Benzodiazepines.: POSITIVE — AB
Cocaine Metabolites: NEGATIVE
Methadone: NEGATIVE
Opiate Screen, Urine: NEGATIVE
Phencyclidine (PCP): NEGATIVE

## 2011-02-12 LAB — BENZODIAZEPINE, QUANTITATIVE, URINE
Alprazolam (GC/LC/MS), ur confirm: 2100 ng/mL
Alprazolam (GC/LC/MS), ur confirm: NEGATIVE
Flurazepam GC/MS Conf: NEGATIVE
Flurazepam GC/MS Conf: NEGATIVE
Oxazepam GC/MS Conf: NEGATIVE

## 2011-02-12 LAB — TSH: TSH: 0.427 (ref 0.350–4.500)

## 2011-02-12 LAB — COMPREHENSIVE METABOLIC PANEL
Albumin: 4.3
BUN: 15
Creatinine, Ser: 0.7
Glucose, Bld: 102 — ABNORMAL HIGH
Total Bilirubin: 1
Total Protein: 7.3

## 2011-02-12 LAB — URINE DRUGS OF ABUSE SCREEN W ALC, ROUTINE (REF LAB)
Barbiturate Quant, Ur: NEGATIVE
Benzodiazepines.: POSITIVE — AB
Cocaine Metabolites: NEGATIVE
Creatinine,U: 202.6
Opiate Screen, Urine: POSITIVE — AB

## 2011-02-12 LAB — URINALYSIS, ROUTINE W REFLEX MICROSCOPIC
Bilirubin Urine: NEGATIVE
Glucose, UA: NEGATIVE
Hgb urine dipstick: NEGATIVE
Ketones, ur: NEGATIVE
Protein, ur: NEGATIVE

## 2011-02-12 LAB — OPIATE, QUANTITATIVE, URINE
Hydrocodone: 1330 ng/mL
Oxymorphone: >5000 ng/mL

## 2011-02-17 LAB — HEPATIC FUNCTION PANEL
AST: 42 — ABNORMAL HIGH
Albumin: 3.9
Alkaline Phosphatase: 49
Bilirubin, Direct: 0.2
Indirect Bilirubin: 0.5
Total Bilirubin: 0.7
Total Bilirubin: 0.9
Total Protein: 6.6

## 2011-02-17 LAB — COMPREHENSIVE METABOLIC PANEL
Albumin: 5.2
Alkaline Phosphatase: 39
Alkaline Phosphatase: 74
BUN: 2 — ABNORMAL LOW
BUN: 10
CO2: 28
Creatinine, Ser: 0.7
GFR calc non Af Amer: >60
Glucose, Bld: 114 — ABNORMAL HIGH
Potassium: 4.3
Potassium: 4.3
Total Protein: 5.9 — ABNORMAL LOW
Total Protein: 8.7 — ABNORMAL HIGH

## 2011-02-17 LAB — CBC
HCT: 38.7
HCT: 39.3
HCT: 44.7
Hemoglobin: 12.1
Hemoglobin: 12.4
Hemoglobin: 13.3
Hemoglobin: 13.3
Hemoglobin: 14
MCHC: 33.9
MCHC: 34.1
MCHC: 34.2
MCHC: 34.3
MCV: 95.3
MCV: 95.4
Platelets: 112 — ABNORMAL LOW
Platelets: 145 — ABNORMAL LOW
Platelets: 147 — ABNORMAL LOW
Platelets: 142 — ABNORMAL LOW
RBC: 3.78 — ABNORMAL LOW
RBC: 4.31
RBC: 4.31
RDW: 13.9
RDW: 14.1
RDW: 14.1
RDW: 14.2
RDW: 13
WBC: 7.4

## 2011-02-17 LAB — URINE CULTURE
Colony Count: NO GROWTH
Culture: NO GROWTH

## 2011-02-17 LAB — BASIC METABOLIC PANEL
BUN: 2 — ABNORMAL LOW
CO2: 25
CO2: 26
Calcium: 8.4
Chloride: 114 — ABNORMAL HIGH
Creatinine, Ser: 0.62
GFR calc Af Amer: >60
GFR calc Af Amer: >60
GFR calc non Af Amer: >60
GFR calc non Af Amer: >60
Glucose, Bld: 106 — ABNORMAL HIGH
Glucose, Bld: 131 — ABNORMAL HIGH
Potassium: 3.4 — ABNORMAL LOW
Sodium: 141
Sodium: 142

## 2011-02-17 LAB — DIFFERENTIAL
Basophils Absolute: 0
Eosinophils Absolute: 0.1
Lymphocytes Relative: 42
Lymphocytes Relative: 5 — ABNORMAL LOW
Lymphs Abs: 1.4
Lymphs Abs: 0.4 — ABNORMAL LOW
Monocytes Absolute: 0.5
Monocytes Absolute: 0.2
Monocytes Relative: 5
Monocytes Relative: 8
Monocytes Relative: 2 — ABNORMAL LOW
Neutro Abs: 2.7
Neutro Abs: 5.5
Neutro Abs: 8 — ABNORMAL HIGH
Neutrophils Relative %: 75

## 2011-02-17 LAB — URINALYSIS, ROUTINE W REFLEX MICROSCOPIC
Glucose, UA: NEGATIVE
Hgb urine dipstick: NEGATIVE
Nitrite: NEGATIVE
Protein, ur: NEGATIVE
Specific Gravity, Urine: 1.024
Specific Gravity, Urine: 1.024
Urobilinogen, UA: 0.2
Urobilinogen, UA: 1
pH: 8

## 2011-02-17 LAB — POCT I-STAT, CHEM 8
BUN: 8
Creatinine, Ser: 1
Glucose, Bld: 84
Hemoglobin: 13.6
Potassium: 3.9
Sodium: 140
TCO2: 24

## 2011-02-17 LAB — OVA AND PARASITE EXAMINATION: Ova and parasites: NONE SEEN

## 2011-02-17 LAB — URINE MICROSCOPIC-ADD ON

## 2011-02-17 LAB — CLOSTRIDIUM DIFFICILE EIA

## 2011-02-17 LAB — GIARDIA/CRYPTOSPORIDIUM SCREEN(EIA)

## 2011-02-17 LAB — STOOL CULTURE

## 2011-02-17 LAB — POCT PREGNANCY, URINE: Preg Test, Ur: NEGATIVE

## 2011-02-17 LAB — LIPASE, BLOOD: Lipase: 25

## 2011-02-17 LAB — CANCER ANTIGEN 19-9: CA 19-9: 24.8 — ABNORMAL LOW (ref <35.0)

## 2011-02-17 LAB — PROTIME-INR: INR: 1

## 2011-02-17 LAB — AMYLASE
Amylase: 31
Amylase: 42

## 2011-02-20 LAB — URINE MICROSCOPIC-ADD ON

## 2011-02-20 LAB — COMPREHENSIVE METABOLIC PANEL
ALT: 53 — ABNORMAL HIGH
Albumin: 3.7
Alkaline Phosphatase: 43
BUN: 1 — ABNORMAL LOW
BUN: 15
CO2: 26
Calcium: 9.5
Chloride: 106
Chloride: 108
Creatinine, Ser: 0.69
GFR calc non Af Amer: >60
Glucose, Bld: 77
Glucose, Bld: 91
Potassium: 3.5
Sodium: 142
Total Bilirubin: 0.7
Total Bilirubin: 1.1
Total Protein: 6.1

## 2011-02-20 LAB — CBC
HCT: 36.3
HCT: 45.2
Hemoglobin: 12.5
Hemoglobin: 14.6
MCHC: 32.3
MCV: 90.8
RBC: 4.98
RDW: 13
WBC: 4.7
WBC: 5.3

## 2011-02-20 LAB — URINALYSIS, ROUTINE W REFLEX MICROSCOPIC
Glucose, UA: NEGATIVE mg/dL
Protein, ur: >300 mg/dL — AB
pH: 6 (ref 5.0–8.0)

## 2011-02-20 LAB — URINE CULTURE: Colony Count: 8000

## 2011-02-23 LAB — CBC
HCT: 40.2
Hemoglobin: 14.1
MCHC: 35.2
MCV: 90.1
Platelets: 158
RDW: 12.6

## 2011-02-23 LAB — URINALYSIS, ROUTINE W REFLEX MICROSCOPIC
Glucose, UA: NEGATIVE
Protein, ur: NEGATIVE
pH: 5.5

## 2011-02-23 LAB — DIFFERENTIAL
Basophils Relative: 1
Lymphocytes Relative: 43
Lymphs Abs: 2.3
Monocytes Absolute: 0.4
Monocytes Relative: 7
Neutro Abs: 2.5
Neutrophils Relative %: 47

## 2011-02-23 LAB — PREGNANCY, URINE: Preg Test, Ur: NEGATIVE

## 2011-02-23 LAB — COMPREHENSIVE METABOLIC PANEL
Albumin: 3.8
Alkaline Phosphatase: 47
BUN: 7
Calcium: 9.2
Creatinine, Ser: 0.6
Glucose, Bld: 103 — ABNORMAL HIGH
Total Protein: 6.4

## 2011-02-24 LAB — COMPREHENSIVE METABOLIC PANEL
ALT: 140 — ABNORMAL HIGH
AST: 104 — ABNORMAL HIGH
CO2: 27
Chloride: 103
Creatinine, Ser: 0.7
GFR calc Af Amer: >60
GFR calc non Af Amer: >60
Glucose, Bld: 106 — ABNORMAL HIGH
Total Bilirubin: 1.4 — ABNORMAL HIGH

## 2011-02-24 LAB — CBC
Hemoglobin: 14.9
MCV: 89.6
RBC: 4.65
WBC: 5.3

## 2011-02-24 LAB — LIPASE, BLOOD: Lipase: 29

## 2011-02-24 LAB — DIFFERENTIAL
Basophils Absolute: 0
Eosinophils Absolute: 0.1 — ABNORMAL LOW
Eosinophils Relative: 1
Lymphocytes Relative: 42
Neutrophils Relative %: 50

## 2011-02-24 LAB — URINALYSIS, ROUTINE W REFLEX MICROSCOPIC
Ketones, ur: >80 — AB
Nitrite: NEGATIVE
Urobilinogen, UA: 1

## 2011-03-23 NOTE — Consult Note (Signed)
[name], [name]        ACCOUNT NO.:  [id number]  MEDICAL RECORD NO.:  [id number]  LOCATION:  1309                         FACILITY:  [facility]  PHYSICIAN:  Dineen Kid. Rana Snare, M.D.    DATE OF BIRTH:  1981/04/22  DATE OF CONSULTATION:  01/12/2011 DATE OF DISCHARGE:                                CONSULTATION   HISTORY OF PRESENT ILLNESS:  [name] is a 30 year old with one previous vaginal delivery who was seen in the emergency room recently for worsening abdominal pain, nausea, vomiting, and diarrhea.  She has been hospitalized several times in the past most recently a month ago and was treated with C difficile colitis, discharged home on vancomycin, but was unable to get the prescription.  She returns this time with worsening abdominal pain to a point that she was unable to eat or drink or hold down food and liquids.  From a GYN perspective, she denies any history of STDs, although she does have a history of hepatitis C whichis currently not active.  She reports having little bit of difficulty getting pregnant 5 years ago, took a year and a half to get pregnant, this is with a different boyfriend and she had a vaginal delivery 4 years ago.  Subsequently had a Mirena IUD placed by the health department, had yearly exams, and had that removed just last year, after 4 years of having this which she states that she wanted to have removed so that she could eventually have another baby and also because she gets recurrent ovarian cyst usually on the right side.  She was placed on Ortho-Cyclen and had been on that until recently and she has come off that recently.  The patient states that she has a history of recurrent ovarian cyst usually on the right side, mostly picked up by pelvic exam and states that they get better when she is on a birth control pill. Does give a family history of a mother with ovarian cancer.  Currently, she is sexually active, having regular menstrual  cycles.  Denies any pelvic pain, or irregularity of her cycles.  PAST MEDICAL HISTORY:  Significant for: 1. Polysubstance abuse. 2. Hepatitis C. 3. History of pancreatitis. 4. Asthma. 5. Recent C difficile colitis. 6. Anxiety and depression.  SURGICAL HISTORY:  She had a cholecystectomy, appendectomy, and nasal surgery, as well as right leg surgery.  ALLERGIES:  Gives an allergy to BACTRIM.  MEDICATIONS:  Currently, she is on Dilaudid drip as well as IV antibiotics.  SOCIAL HISTORY:  She is a smoker, nondrinker.  She denies any STDs.  PHYSICAL EXAMINATION:  VITAL SIGNS:  Recent exam was reviewed with a blood pressure of 109/76, temperature 98.2, heart rate of 94. ABDOMEN:  Soft, nondistended.  She reports pain to the mid to upper left quadrant to deep palpation.  Denies any pelvic pain or discomfort on exam.  There is no rebound or guarding.  No flank pain noted. PELVIC EXAM:  Deferred.  Ultrasound and CT of the abdomen and pelvis were performed today.  These were reviewed and discussed with the patient.  CT of the abdomen andpelvis was essentially normal except for a benign physiologic right ovarian cyst, followup ultrasound.  Pelvic ultrasound shows  a 3.2 cm complex right ovarian cyst with benign characteristics.  Normal- appearing uterus and left ovarian cyst.  There is a small amount of free fluid in the cul-de-sac.  IMPRESSION AND PLAN:  From a gynecologic perspective, she does have a small right ovarian cyst which has benign characteristics and is consistent with a history of recurrent ovarian cyst over the last number of years.  She does give a history of having difficulty getting pregnant, which could represent some concerns for endometriosis; however, the pain in the left upper quadrant and midepigastric is very unlikely given the current scenario and no pelvic discomfort pain or pain with intercourse.  Given that she has no gynecologic symptomatology at this time  other than she knows when she gets a simple ovarian cyst. Discussed different options with Monae.  At this time, I would recommend she go back on her birth control pill for ovulation suppression and regulation of her menses.  I did caution her that with hepatitis, we will follow the liver enzymes and if she has true function of her liver, it should not affect the birth control pills efficacy.  We also have to be concerned sometimes about birth control pills, causing liver growths.  With normal liver function, she should be able to do birth control pills without either of these problems.  I did recommend she follow up with a GYN on a regular continuing basis for long-term treatment of ovarian cyst and concerns about fertility down the road when she is recovered from her multiple medical problems and we would recommend procreative counseling and discussion of medications and some of her social habits that should be discontinued before she attempts conception.  My recommendation for the abdominal pain would be to continue the current course and I think that the pain most likely is not gynecologic in nature.  When she is discharged, I would recommend she follow up either at the family practice clinic care in town for ongoing care possibly the faculty gyn service at [facility].  She may need referral to get into the clinic where she be followed for recurrent ovarian cyst and possibly fertility challenges and also for obstetrical care when she chooses to conceive.  I do recommend she back on the birth control pill and follow up ultrasound in the 6 to 12 weeks as recommended by her radiologist to assure that there continue to have benign characteristics on the ultrasound.     Dineen Kid Rana Snare, M.D.     DCL/MEDQ  D:  01/12/2011  T:  01/13/2011  Job:  [id number]  Electronically Signed by Candice Camp M.D. on 03/23/2011 06:07:52 PM

## 2012-06-26 IMAGING — CR DG CHEST 1V PORT
1 series · 1 of 1 positions shown · non-contrast
Comparison: 06/29/2008

CLINICAL DATA: Weakness.  History of asthma and smoking.  Rule out
pneumonia.

PORTABLE CHEST - 1 VIEW

[view not recorded]
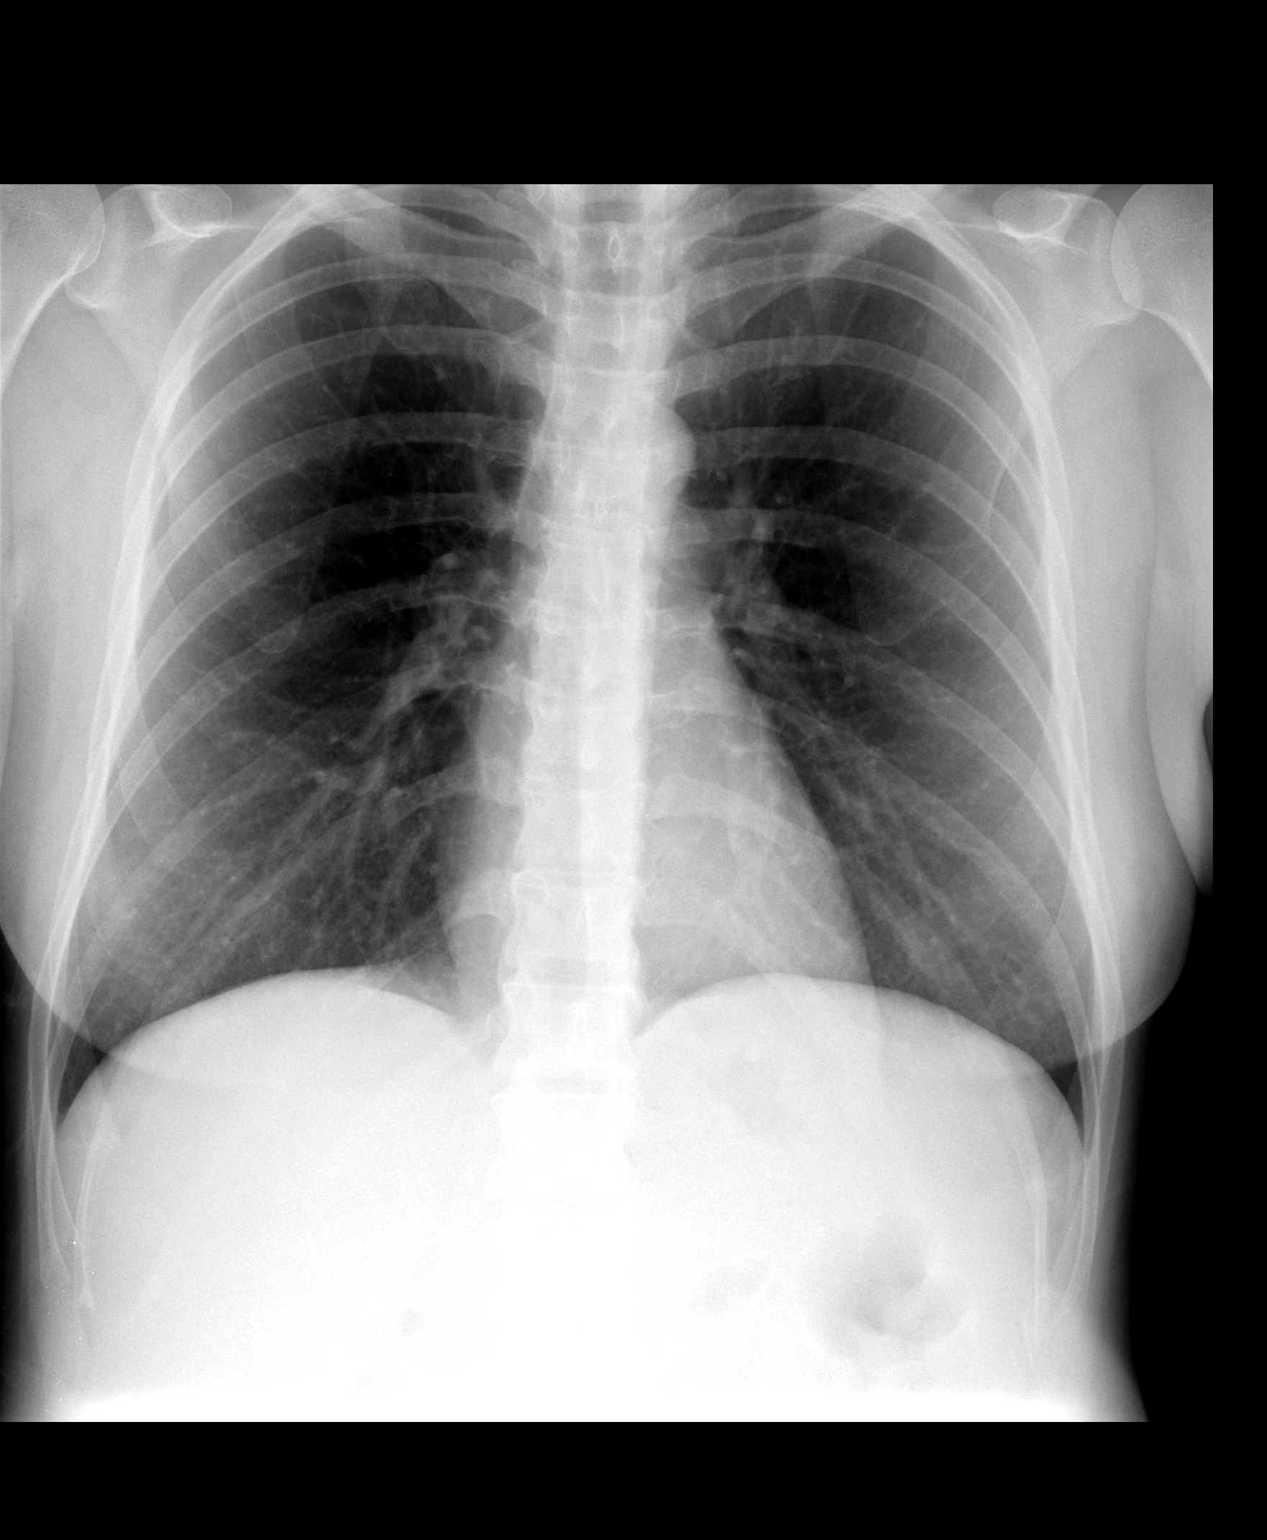

[1 of 1 positions shown; findings below may reference images not displayed]

FINDINGS: The heart size and pulmonary vascularity are normal. The
lungs appear clear and expanded without focal air space disease or
consolidation. No blunting of the costophrenic angles.  No
significant change since previous study. Surgical clips in the
right upper quadrant.
IMPRESSION: No evidence of active pulmonary disease.

## 2020-01-27 ENCOUNTER — Encounter (HOSPITAL_COMMUNITY): Payer: Self-pay

## 2020-01-27 ENCOUNTER — Other Ambulatory Visit: Payer: Self-pay

## 2020-01-27 ENCOUNTER — Inpatient Hospital Stay (HOSPITAL_COMMUNITY)
Admission: EM | Admit: 2020-01-27 | Discharge: 2020-01-30 | DRG: 190 | Disposition: A | Discharge status: Home / Self Care | Payer: Medicaid Other | Attending: Internal Medicine | Admitting: Internal Medicine

## 2020-01-27 ENCOUNTER — Emergency Department (HOSPITAL_COMMUNITY): Payer: Medicaid Other

## 2020-01-27 DIAGNOSIS — J9601 Acute respiratory failure with hypoxia: Secondary | ICD-10-CM

## 2020-01-27 DIAGNOSIS — Z20822 Contact with and (suspected) exposure to covid-19: Secondary | ICD-10-CM | POA: Diagnosis present

## 2020-01-27 DIAGNOSIS — F419 Anxiety disorder, unspecified: Secondary | ICD-10-CM | POA: Diagnosis present

## 2020-01-27 DIAGNOSIS — J45901 Unspecified asthma with (acute) exacerbation: Secondary | ICD-10-CM | POA: Diagnosis present

## 2020-01-27 DIAGNOSIS — F319 Bipolar disorder, unspecified: Secondary | ICD-10-CM | POA: Diagnosis present

## 2020-01-27 DIAGNOSIS — Z885 Allergy status to narcotic agent status: Secondary | ICD-10-CM

## 2020-01-27 DIAGNOSIS — R112 Nausea with vomiting, unspecified: Secondary | ICD-10-CM | POA: Diagnosis present

## 2020-01-27 DIAGNOSIS — F17210 Nicotine dependence, cigarettes, uncomplicated: Secondary | ICD-10-CM | POA: Diagnosis present

## 2020-01-27 DIAGNOSIS — G43909 Migraine, unspecified, not intractable, without status migrainosus: Secondary | ICD-10-CM | POA: Diagnosis present

## 2020-01-27 DIAGNOSIS — Z79899 Other long term (current) drug therapy: Secondary | ICD-10-CM

## 2020-01-27 DIAGNOSIS — Z720 Tobacco use: Secondary | ICD-10-CM

## 2020-01-27 DIAGNOSIS — G8929 Other chronic pain: Secondary | ICD-10-CM | POA: Diagnosis present

## 2020-01-27 DIAGNOSIS — Z881 Allergy status to other antibiotic agents status: Secondary | ICD-10-CM

## 2020-01-27 DIAGNOSIS — J4521 Mild intermittent asthma with (acute) exacerbation: Secondary | ICD-10-CM

## 2020-01-27 DIAGNOSIS — J441 Chronic obstructive pulmonary disease with (acute) exacerbation: Principal | ICD-10-CM | POA: Diagnosis present

## 2020-01-27 LAB — CBC WITH DIFFERENTIAL/PLATELET
Abs Immature Granulocytes: 0.03 10*3/uL (ref 0.00–0.07)
Basophils Absolute: 0 10*3/uL (ref 0.0–0.1)
Basophils Relative: 0 %
Eosinophils Absolute: 0.2 10*3/uL (ref 0.0–0.5)
Eosinophils Relative: 2 %
HCT: 43.3 % (ref 36.0–46.0)
Hemoglobin: 14.1 g/dL (ref 12.0–15.0)
Immature Granulocytes: 0 %
Lymphocytes Relative: 26 %
Lymphs Abs: 2.2 10*3/uL (ref 0.7–4.0)
MCH: 28.9 pg (ref 26.0–34.0)
MCHC: 32.6 g/dL (ref 30.0–36.0)
MCV: 88.7 fL (ref 80.0–100.0)
Monocytes Absolute: 0.6 10*3/uL (ref 0.1–1.0)
Monocytes Relative: 8 %
Neutro Abs: 5.3 10*3/uL (ref 1.7–7.7)
Neutrophils Relative %: 64 %
Platelets: 151 10*3/uL (ref 150–400)
RBC: 4.88 MIL/uL (ref 3.87–5.11)
RDW: 15.2 % (ref 11.5–15.5)
WBC: 8.4 10*3/uL (ref 4.0–10.5)
nRBC: 0 % (ref 0.0–0.2)

## 2020-01-27 LAB — SARS CORONAVIRUS 2 BY RT PCR (HOSPITAL ORDER, PERFORMED IN ~~LOC~~ HOSPITAL LAB): SARS Coronavirus 2: NEGATIVE

## 2020-01-27 MED ORDER — ONDANSETRON HCL 4 MG/2ML IJ SOLN
4.0000 mg | Freq: Once | INTRAMUSCULAR | Status: AC
  Administered 2020-01-27: 4 mg via INTRAVENOUS
  Filled 2020-01-27: qty 2

## 2020-01-27 MED ORDER — FENTANYL CITRATE (PF) 100 MCG/2ML IJ SOLN
50.0000 ug | Freq: Once | INTRAMUSCULAR | Status: AC
  Administered 2020-01-27: 50 ug via INTRAVENOUS
  Filled 2020-01-27: qty 2

## 2020-01-27 MED ORDER — METHYLPREDNISOLONE SODIUM SUCC 125 MG IJ SOLR
125.0000 mg | Freq: Once | INTRAMUSCULAR | Status: AC
  Administered 2020-01-27: 125 mg via INTRAVENOUS
  Filled 2020-01-27: qty 2

## 2020-01-27 NOTE — ED Triage Notes (Addendum)
Pt brought in by [facility] for reports headache, fever, body aches, chills, and cough that started 4 days ago. EMS reports pt O2 sats were in the low 90's- high 80's on room air, they placed pt on 3 L O2.  Pt O2 sats are 91% in triage on room air. Pt breathing is not labored or fast. No acute distress noted. Pt placed back on 2L oxygen

## 2020-01-27 NOTE — ED Provider Notes (Signed)
[facility] EMERGENCY DEPARTMENT Provider Note   CSN: [id number] Arrival date & time: 01/27/20  1947     History Chief Complaint  Patient presents with  . Fever  . Generalized Body Aches  . Cough    [name] is a 39 y.o. female.  Pt presents to the ED with sob and cough.  Sx started 4 days ago.  The pt called EMS and her RA O2 sats were in the upper 80s.  They placed her on 3L oxygen.  Pt has not been vaccinated against Covid.        Past Medical History:  Diagnosis Date  . Asthma   . Colitis   . Hepatitis C   . Pancreatitis     Patient Active Problem List   Diagnosis Date Noted  . PELVIC PAIN, RIGHT 10/02/2009  . ACUTE PHARYNGITIS 08/26/2009  . CROHN'S DISEASE 07/18/2009  . PANCREATITIS 07/18/2009  . LEG PAIN, RIGHT 07/18/2009  . ASTHMA 03/22/2008  . HEPATITIS C 03/21/2008  . BIPOLAR AFFECTIVE DISORDER 03/21/2008  . ANXIETY 03/21/2008  . SOMATIZATION DISORDER 03/21/2008  . DEPRESSION 03/21/2008  . CHRONIC PAIN SYNDROME 03/21/2008  . MIGRAINE HEADACHE 03/21/2008  . COLITIS 03/21/2008  . ACUTE CYSTITIS 03/21/2008  . AMENORRHEA 03/21/2008  . NAUSEA AND VOMITING 03/21/2008  . ABDOMINAL PAIN 03/21/2008    Past Surgical History:  Procedure Laterality Date  . APPENDECTOMY    . CHOLECYSTECTOMY    . LEG SURGERY    . nasal sugery       OB History   No obstetric history on file.     No family history on file.  Social History   Tobacco Use  . Smoking status: Current Every Day Smoker    Packs/day: 1.00    Types: Cigarettes  . Smokeless tobacco: Never Used  Substance Use Topics  . Alcohol use: No    Comment: former drinker  . Drug use: No    Home Medications Prior to Admission medications   Medication Sig Start Date End Date Taking? Authorizing Provider  albuterol (PROVENTIL) (2.5 MG/3ML) 0.083% nebulizer solution Take 2.5 mg by nebulization 3 (three) times daily as needed. Shortness of breath and wheezing     [provider]  clonazePAM (KLONOPIN) 2 MG tablet Take 2 mg by mouth 3 (three) times daily.      [provider]  gabapentin (NEURONTIN) 800 MG tablet Take 800 mg by mouth 3 (three) times daily.      [provider]  HYDROcodone-acetaminophen (NORCO) 10-325 MG per tablet Take 1 tablet by mouth 3 (three) times daily as needed. Pain      [provider]  HYDROmorphone (DILAUDID) 4 MG tablet Take 4 mg by mouth 3 (three) times daily as needed. Pain      [provider]  sertraline (ZOLOFT) 100 MG tablet Take 100 mg by mouth daily.      [provider]  traZODone (DESYREL) 100 MG tablet Take 200 mg by mouth at bedtime.      [provider]    Allergies    Bactrim, Clindamycin/lincomycin, Darvocet [propoxyphene n-acetaminophen], Morphine and related, and Toradol [ketorolac tromethamine]  Review of Systems   Review of Systems  Constitutional: Positive for chills and fever.  Respiratory: Positive for cough and shortness of breath.   All other systems reviewed and are negative.   Physical Exam Updated Vital Signs BP 105/77   Pulse 91   Temp 99.4 F (37.4 C) (Oral)  Resp (!) 22   Ht 5\' 3"  (1.6 m)   Wt 71.2 kg   LMP 01/23/2020   SpO2 96%   BMI 27.81 kg/m   Physical Exam Vitals and nursing note reviewed.  Constitutional:      Appearance: Normal appearance. She is ill-appearing.  HENT:     Head: Normocephalic and atraumatic.     Right Ear: External ear normal.     Left Ear: External ear normal.     Nose: Nose normal.     Mouth/Throat:     Mouth: Mucous membranes are moist.     Pharynx: Oropharynx is clear.  Eyes:     Extraocular Movements: Extraocular movements intact.     Conjunctiva/sclera: Conjunctivae normal.     Pupils: Pupils are equal, round, and reactive to light.  Cardiovascular:     Rate and Rhythm: Regular rhythm. Tachycardia present.     Pulses: Normal pulses.     Heart sounds: Normal heart sounds.    Pulmonary:     Effort: Tachypnea present.     Breath sounds: Wheezing present.  Abdominal:     General: Abdomen is flat. Bowel sounds are normal.     Palpations: Abdomen is soft.  Musculoskeletal:        General: Normal range of motion.     Cervical back: Normal range of motion and neck supple.  Skin:    General: Skin is warm.     Capillary Refill: Capillary refill takes less than 2 seconds.  Neurological:     General: No focal deficit present.     Mental Status: She is alert and oriented to person, place, and time.  Psychiatric:        Mood and Affect: Mood normal.        Behavior: Behavior normal.     ED Results / Procedures / Treatments   Labs (all labs ordered are listed, but only abnormal results are displayed) Labs Reviewed  SARS CORONAVIRUS 2 BY RT PCR (HOSPITAL ORDER, PERFORMED IN [facility])  CULTURE, BLOOD (ROUTINE X 2)  CULTURE, BLOOD (ROUTINE X 2)  RESPIRATORY PANEL BY PCR  CBC WITH DIFFERENTIAL/PLATELET  BASIC METABOLIC PANEL  BRAIN NATRIURETIC PEPTIDE  LACTIC ACID, PLASMA  LACTIC ACID, PLASMA    EKG None  Radiology DG Chest 2 View  Result Date: 01/27/2020 CLINICAL DATA:  Fever, body aches and chills with cough x4 days. EXAM: CHEST - 2 VIEW COMPARISON:  January 18, 2011 FINDINGS: There is no evidence of acute infiltrate, pleural effusion or pneumothorax. The heart size and mediastinal contours are within normal limits. The visualized skeletal structures are unremarkable. Radiopaque surgical clips are seen overlying the right upper quadrant. IMPRESSION: No active cardiopulmonary disease. Electronically Signed   By: January 20, 2011 M.D.   On: 01/27/2020 23:03    Procedures Procedures (including critical care time)  Medications Ordered in ED Medications  methylPREDNISolone sodium succinate (SOLU-MEDROL) 125 mg/2 mL injection 125 mg (125 mg Intravenous Given 01/27/20 2331)  ondansetron (ZOFRAN) injection 4 mg (4 mg Intravenous Given  01/27/20 2331)  fentaNYL (SUBLIMAZE) injection 50 mcg (50 mcg Intravenous Given 01/27/20 2330)    ED Course  I have reviewed the triage vital signs and the nursing notes.  Pertinent labs & imaging results that were available during my care of the patient were reviewed by me and considered in my medical decision making (see chart for details).    MDM Rules/Calculators/A&P  Covid neg.  CXR neg.  Pt's labs are pending.  Pt signed out to Dr. Wilkie Aye at shift change.  Final Clinical Impression(s) / ED Diagnoses Final diagnoses:  Mild intermittent asthma with exacerbation  Tobacco abuse    Rx / DC Orders ED Discharge Orders    None       Jacalyn Lefevre, MD 01/27/20 2355

## 2020-01-28 DIAGNOSIS — G43909 Migraine, unspecified, not intractable, without status migrainosus: Secondary | ICD-10-CM | POA: Diagnosis present

## 2020-01-28 DIAGNOSIS — J9601 Acute respiratory failure with hypoxia: Secondary | ICD-10-CM | POA: Diagnosis present

## 2020-01-28 DIAGNOSIS — Z885 Allergy status to narcotic agent status: Secondary | ICD-10-CM | POA: Diagnosis not present

## 2020-01-28 DIAGNOSIS — F419 Anxiety disorder, unspecified: Secondary | ICD-10-CM | POA: Diagnosis present

## 2020-01-28 DIAGNOSIS — J45901 Unspecified asthma with (acute) exacerbation: Secondary | ICD-10-CM | POA: Diagnosis present

## 2020-01-28 DIAGNOSIS — G8929 Other chronic pain: Secondary | ICD-10-CM | POA: Diagnosis present

## 2020-01-28 DIAGNOSIS — R112 Nausea with vomiting, unspecified: Secondary | ICD-10-CM

## 2020-01-28 DIAGNOSIS — Z20822 Contact with and (suspected) exposure to covid-19: Secondary | ICD-10-CM | POA: Diagnosis present

## 2020-01-28 DIAGNOSIS — Z79899 Other long term (current) drug therapy: Secondary | ICD-10-CM | POA: Diagnosis not present

## 2020-01-28 DIAGNOSIS — J4521 Mild intermittent asthma with (acute) exacerbation: Secondary | ICD-10-CM | POA: Diagnosis present

## 2020-01-28 DIAGNOSIS — Z881 Allergy status to other antibiotic agents status: Secondary | ICD-10-CM | POA: Diagnosis not present

## 2020-01-28 DIAGNOSIS — F319 Bipolar disorder, unspecified: Secondary | ICD-10-CM | POA: Diagnosis present

## 2020-01-28 DIAGNOSIS — J441 Chronic obstructive pulmonary disease with (acute) exacerbation: Secondary | ICD-10-CM | POA: Diagnosis present

## 2020-01-28 DIAGNOSIS — R509 Fever, unspecified: Secondary | ICD-10-CM | POA: Diagnosis present

## 2020-01-28 DIAGNOSIS — F17210 Nicotine dependence, cigarettes, uncomplicated: Secondary | ICD-10-CM | POA: Diagnosis present

## 2020-01-28 LAB — RESPIRATORY PANEL BY PCR

## 2020-01-28 LAB — BASIC METABOLIC PANEL
Anion gap: 9 (ref 5–15)
BUN: 7 mg/dL (ref 6–20)
CO2: 25 mmol/L (ref 22–32)
Calcium: 9 mg/dL (ref 8.9–10.3)
Chloride: 103 mmol/L (ref 98–111)
Creatinine, Ser: 0.57 mg/dL (ref 0.44–1.00)
GFR calc Af Amer: >60 mL/min (ref >60)
GFR calc non Af Amer: >60 mL/min (ref >60)
Glucose, Bld: 103 mg/dL — ABNORMAL HIGH (ref 70–99)
Potassium: 3.6 mmol/L (ref 3.5–5.1)
Sodium: 137 mmol/L (ref 135–145)

## 2020-01-28 LAB — LACTIC ACID, PLASMA
Lactic Acid, Venous: 0.5 mmol/L (ref 0.5–1.9)
Lactic Acid, Venous: 0.7 mmol/L (ref 0.5–1.9)

## 2020-01-28 LAB — PROTIME-INR
INR: 1.1 (ref 0.8–1.2)
Prothrombin Time: 13.7 seconds (ref 11.4–15.2)

## 2020-01-28 LAB — APTT: aPTT: 27 seconds (ref 24–36)

## 2020-01-28 LAB — BRAIN NATRIURETIC PEPTIDE: B Natriuretic Peptide: 116 pg/mL — ABNORMAL HIGH (ref 0.0–100.0)

## 2020-01-28 LAB — PHOSPHORUS: Phosphorus: 2.3 mg/dL — ABNORMAL LOW (ref 2.5–4.6)

## 2020-01-28 LAB — MAGNESIUM: Magnesium: 2.1 mg/dL (ref 1.7–2.4)

## 2020-01-28 LAB — HIV ANTIBODY (ROUTINE TESTING W REFLEX): HIV Screen 4th Generation wRfx: NONREACTIVE

## 2020-01-28 MED ORDER — ALBUTEROL SULFATE (2.5 MG/3ML) 0.083% IN NEBU: 2.5000 mg | INHALATION_SOLUTION | RESPIRATORY_TRACT | Status: DC | PRN

## 2020-01-28 MED ORDER — AZITHROMYCIN 250 MG PO TABS: 500.0000 mg | ORAL_TABLET | Freq: Every day | ORAL | Status: DC

## 2020-01-28 MED ORDER — ENOXAPARIN SODIUM 40 MG/0.4ML ~~LOC~~ SOLN
40.0000 mg | Freq: Every day | SUBCUTANEOUS | Status: DC
  Administered 2020-01-28 – 2020-01-30 (×3): 40 mg via SUBCUTANEOUS
  Filled 2020-01-28 (×3): qty 0.4

## 2020-01-28 MED ORDER — METHYLPREDNISOLONE SODIUM SUCC 40 MG IJ SOLR
40.0000 mg | Freq: Two times a day (BID) | INTRAMUSCULAR | Status: DC
  Administered 2020-01-28 – 2020-01-29 (×4): 40 mg via INTRAVENOUS
  Filled 2020-01-28 (×4): qty 1

## 2020-01-28 MED ORDER — ONDANSETRON HCL 4 MG/2ML IJ SOLN
4.0000 mg | Freq: Four times a day (QID) | INTRAMUSCULAR | Status: DC | PRN
  Administered 2020-01-28 – 2020-01-30 (×6): 4 mg via INTRAVENOUS
  Filled 2020-01-28 (×6): qty 2

## 2020-01-28 MED ORDER — HYDROCODONE-ACETAMINOPHEN 10-325 MG PO TABS
1.0000 | ORAL_TABLET | Freq: Three times a day (TID) | ORAL | Status: DC | PRN
  Administered 2020-01-28 – 2020-01-30 (×4): 1 via ORAL
  Filled 2020-01-28 (×5): qty 1

## 2020-01-28 MED ORDER — K PHOS MONO-SOD PHOS DI & MONO 155-852-130 MG PO TABS
500.0000 mg | ORAL_TABLET | Freq: Once | ORAL | Status: AC
  Administered 2020-01-28: 500 mg via ORAL
  Filled 2020-01-28: qty 2

## 2020-01-28 MED ORDER — DM-GUAIFENESIN ER 30-600 MG PO TB12
1.0000 | ORAL_TABLET | Freq: Two times a day (BID) | ORAL | Status: DC
  Administered 2020-01-28 – 2020-01-30 (×5): 1 via ORAL
  Filled 2020-01-28 (×5): qty 1

## 2020-01-28 MED ORDER — PANTOPRAZOLE SODIUM 40 MG PO TBEC
40.0000 mg | DELAYED_RELEASE_TABLET | Freq: Every day | ORAL | Status: DC
  Administered 2020-01-28 – 2020-01-30 (×3): 40 mg via ORAL
  Filled 2020-01-28 (×3): qty 1

## 2020-01-28 MED ORDER — IPRATROPIUM-ALBUTEROL 0.5-2.5 (3) MG/3ML IN SOLN
3.0000 mL | Freq: Four times a day (QID) | RESPIRATORY_TRACT | Status: DC
  Administered 2020-01-28 – 2020-01-29 (×7): 3 mL via RESPIRATORY_TRACT
  Filled 2020-01-28 (×7): qty 3

## 2020-01-28 MED ORDER — ALBUTEROL (5 MG/ML) CONTINUOUS INHALATION SOLN
15.0000 mg/h | INHALATION_SOLUTION | Freq: Once | RESPIRATORY_TRACT | Status: AC
  Administered 2020-01-28: 15 mg/h via RESPIRATORY_TRACT
  Filled 2020-01-28: qty 20

## 2020-01-28 MED ORDER — AZITHROMYCIN 250 MG PO TABS: 250.0000 mg | ORAL_TABLET | Freq: Every day | ORAL | Status: DC

## 2020-01-28 MED ORDER — ACETAMINOPHEN 325 MG PO TABS
650.0000 mg | ORAL_TABLET | Freq: Four times a day (QID) | ORAL | Status: DC | PRN
  Administered 2020-01-28: 650 mg via ORAL
  Filled 2020-01-28: qty 2

## 2020-01-28 MED ORDER — DOXYCYCLINE HYCLATE 100 MG PO TABS
100.0000 mg | ORAL_TABLET | Freq: Two times a day (BID) | ORAL | Status: DC
  Administered 2020-01-28 – 2020-01-30 (×5): 100 mg via ORAL
  Filled 2020-01-28 (×5): qty 1

## 2020-01-28 MED ORDER — IPRATROPIUM-ALBUTEROL 0.5-2.5 (3) MG/3ML IN SOLN: 3.0000 mL | RESPIRATORY_TRACT | Status: DC | PRN

## 2020-01-28 MED ORDER — IPRATROPIUM BROMIDE 0.02 % IN SOLN
0.5000 mg | Freq: Once | RESPIRATORY_TRACT | Status: AC
  Administered 2020-01-28: 0.5 mg via RESPIRATORY_TRACT
  Filled 2020-01-28: qty 2.5

## 2020-01-28 NOTE — ED Notes (Signed)
Pt seems to be drowsy during medication administration.  C/o headache rating pain 10/10.

## 2020-01-28 NOTE — H&P (Signed)
History and Physical  [name] RXV:[id number] DOB: Feb 02, 1981 DOA: 01/27/2020  Referring physician: Shon Baton, MD PCP: Glennis Brink, DO (Inactive)  Patient coming from: Home  Chief Complaint: Cough and wheezing  HPI: [name] is a 39 y.o. female with medical history significant for asthma, migraine headache and tobacco abuse who presents to the emergency department due to increasing shortness of breath.  Patient complained of 4-day onset of cough, generalized body aches, nausea and nonbloody vomiting (about twice daily).  She complained of increased wheezing without much improvement with home breathing treatment, patient continued to smoke cigarettes.  She activated EMS due to increased work of breathing, she states that O2 sat at home was in the upper 80s.  On arrival of EMS, patient was placed on supplemental oxygen at three LPM.  She has not been vaccinated against Covid.  ED Course:  In the emergency department, she was tachycardic and intermittently mildly tachypneic, O2 sat was 92-96% on supplemental oxygen via [place] at two LPM (patient does not use supplemental oxygen at baseline).  Work-up in the ED showed normal CBC and BMP, BNP was 116, SARS coronavirus two was negative.  Chest x-ray showed no active cardiopulmonary disease.  She was provided with a breathing treatment, IV Solu-Medrol 125 mg x 1 was given.  Hospitalist was asked to admit.  For further evaluation and management.  Review of Systems: Constitutional: Negative for chills and fever.  HENT: Negative for ear pain and sore throat.   Eyes: Negative for pain and visual disturbance.  Respiratory: Intermittently tachypneic.  Positive for cough, shortness of breath and wheezing  Cardiovascular: Negative for chest pain and palpitations.  Gastrointestinal: Negative for abdominal pain and vomiting.  Endocrine: Negative for polyphagia and polyuria.  Genitourinary: Negative for decreased urine volume,  dysuria Musculoskeletal: Negative for arthralgias and back pain.  Skin: Negative for color change and rash.  Allergic/Immunologic: Negative for immunocompromised state.  Neurological: Positive for headache.  Negative for tremors, syncope, speech difficulty, weakness, light-headedness  Hematological: Does not bruise/bleed easily.  All other systems reviewed and are negative   Past Medical History:  Diagnosis Date  . Asthma   . Colitis   . Hepatitis C   . Pancreatitis    Past Surgical History:  Procedure Laterality Date  . APPENDECTOMY    . CHOLECYSTECTOMY    . LEG SURGERY    . nasal sugery      Social History:  reports that she has been smoking cigarettes. She has been smoking about 1.00 pack per day. She has never used smokeless tobacco. She reports that she does not drink alcohol and does not use drugs.   Allergies  Allergen Reactions  . Bactrim     Caused "bleeding in my urine"  . Clindamycin/Lincomycin Other (See Comments)    "peeing blood-needed blood transfusion"  . Darvocet [Propoxyphene N-Acetaminophen] Nausea And Vomiting  . Morphine And Related Itching    Makes nausea worse  . Toradol [Ketorolac Tromethamine] Hives    No family history on file.    Prior to Admission medications   Medication Sig Start Date End Date Taking? Authorizing Provider  albuterol (PROVENTIL) (2.5 MG/3ML) 0.083% nebulizer solution Take 2.5 mg by nebulization 3 (three) times daily as needed. Shortness of breath and wheezing     [provider]  clonazePAM (KLONOPIN) 2 MG tablet Take 2 mg by mouth 3 (three) times daily.      [provider]  gabapentin (NEURONTIN) 800 MG tablet  Take 800 mg by mouth 3 (three) times daily.      [provider]  HYDROcodone-acetaminophen (NORCO) 10-325 MG per tablet Take 1 tablet by mouth 3 (three) times daily as needed. Pain      [provider]  HYDROmorphone (DILAUDID) 4 MG tablet Take 4 mg by mouth 3 (three) times daily  as needed. Pain      [provider]  sertraline (ZOLOFT) 100 MG tablet Take 100 mg by mouth daily.      [provider]  traZODone (DESYREL) 100 MG tablet Take 200 mg by mouth at bedtime.      [provider]    Physical Exam: BP 106/65   Pulse (!) 102   Temp 99.4 F (37.4 C) (Oral)   Resp 18   Ht 5\' 3"  (1.6 m)   Wt 71.2 kg   LMP 01/23/2020   SpO2 93%   BMI 27.81 kg/m   . General: 39 y.o. year-old female well developed well nourished in no acute distress.  Alert and oriented x3. 41 HEENT: Normocephalic, atraumatic . Neck: Supple, trachea midline . Cardiovascular: Regular rate and rhythm with no rubs or gallops.  No thyromegaly or JVD noted.  No lower extremity edema. 2/4 pulses in all 4 extremities. Marland Kitchen Respiratory: Diffuse expiratory wheezing noted on  auscultation  . Abdomen: Soft nontender nondistended with normal bowel sounds x4 quadrants. . Muskuloskeletal: No cyanosis, clubbing or edema noted bilaterally . Neuro: CN II-XII intact, strength, sensation, reflexes . Skin: No ulcerative lesions noted or rashes . Psychiatry: Judgement and insight appear normal. Mood is appropriate for condition and setting          Labs on Admission:  Basic Metabolic Panel: Recent Labs  Lab 01/27/20 2325  NA 137  K 3.6  CL 103  CO2 25  GLUCOSE 103*  BUN 7  CREATININE 0.57  CALCIUM 9.0   Liver Function Tests: No results for input(s): AST, ALT, ALKPHOS, BILITOT, PROT, ALBUMIN in the last 168 hours. No results for input(s): LIPASE, AMYLASE in the last 168 hours. No results for input(s): AMMONIA in the last 168 hours. CBC: Recent Labs  Lab 01/27/20 2325  WBC 8.4  NEUTROABS 5.3  HGB 14.1  HCT 43.3  MCV 88.7  PLT 151   Cardiac Enzymes: No results for input(s): CKTOTAL, CKMB, CKMBINDEX, TROPONINI in the last 168 hours.  BNP (last 3 results) Recent Labs    01/27/20 2325  BNP 116.0*    ProBNP (last 3 results) No results for input(s): PROBNP in  the last 8760 hours.  CBG: No results for input(s): GLUCAP in the last 168 hours.  Radiological Exams on Admission: DG Chest 2 View  Result Date: 01/27/2020 CLINICAL DATA:  Fever, body aches and chills with cough x4 days. EXAM: CHEST - 2 VIEW COMPARISON:  January 18, 2011 FINDINGS: There is no evidence of acute infiltrate, pleural effusion or pneumothorax. The heart size and mediastinal contours are within normal limits. The visualized skeletal structures are unremarkable. Radiopaque surgical clips are seen overlying the right upper quadrant. IMPRESSION: No active cardiopulmonary disease. Electronically Signed   By: January 20, 2011 M.D.   On: 01/27/2020 23:03    EKG: I independently viewed the EKG done and my findings are as followed: No EKG was done in the ED  Assessment/Plan Present on Admission: . Asthma exacerbation . Migraine headache . NAUSEA AND VOMITING  Principal Problem:   Asthma exacerbation Active Problems:   Migraine headache   NAUSEA AND  VOMITING   Acute respiratory failure with hypoxia (HCC)  Acute respiratory with hypoxia secondary to exacerbation of asthma Continue duo nebs, Mucinex, Solu-Medrol, azithromycin. Continue Protonix to prevent steroid-induced ulcer Continue incentive spirometry and flutter valve Continue supplemental oxygen to maintain O2 sat > 92% with plan to wean patient off oxygen as tolerated  Nausea and vomiting Continue IV Zofran 4 mg every 6 hours as needed  Migraine headache Currently stable, continue Tylenol 650 mg p.o. every 6 hours.   DVT prophylaxis: Lovenox  Code Status: Full code  Family Communication: None at bedside  Disposition Plan:  Patient is from:                        home Anticipated DC to:                   SNF or family members home Anticipated DC date:               2-3 days Anticipated DC barriers:          Patient is unstable for discharge at this time due to requiring supplemental oxygen secondary to asthma  exacerbation.    Consults called: None  Admission status: Observation    Frankey Shown MD [facility]  If 7PM-7AM, please contact night-coverage [email or web address] Password [facility]  01/28/2020, 6:50 AM

## 2020-01-28 NOTE — ED Notes (Signed)
Dr Pola Corn paged.  Pt c/o headache and nausea.

## 2020-01-28 NOTE — Progress Notes (Signed)
PROGRESS NOTE  [name]  DOB: 06-Aug-1980  PCP: Glennis Brink, DO (Inactive) [id number]  DOA: 01/27/2020  LOS: 0 days   Chief Complaint  Patient presents with   Fever   Generalized Body Aches   Cough    Brief narrative: [name] is a 39 y.o. female with PMH of chronic asthma, migraine, chronic daily smoking, hepatitis C, unvaccinated to Covid. Patient presented to the ED on 01/27/2020 with 3 to 4-day history of progressively worsening shortness of breath, cough, generalized body ache, nausea, vomiting, headache. Shortness of breath and wheezing did not improve with use of inhalers at home. EMS noted oxygen saturation to be in upper 80s and hence brought to the ED on nasal cannula.  In the ED, patient had a temperature of nine 9.4, heart rate between 100 and 110, oxygen saturation more than 90% on 3 L oxygen. Labs showed normal CBC, BMP. COVID-19 PCR negative. Chest x-ray did not show any acute disease. Patient was given IV Solu-Medrol, breathing treatment and admitted to hospitalist service for further evaluation and management.  Subjective: Patient was seen and examined this morning in the ED. Feels tired.  No wheezing but has coughing spells.  On supplemental oxygen.  Assessment/Plan: Acute respiratory failure with hypoxia Acute exacerbation of asthma -Presented with progressively worsening shortness of breath, cough, wheezing in the setting of chronic asthma -No infiltrate on chest x-ray. -No wheezing but still dependent on supplemental oxygen and has violent episodes of cough -Continue IV Solu-Medrol 40 mg IV twice daily, and duo nebs.  Switch from azithromycin to doxycycline because patient is on multiple psych medications and is at risk of QTC pronation.  Continue Protonix while on steroids -Encourage incentive spirometry and flutter valve. -Continue supplemental oxygen to maintain more than 90% O2 sat.  Wean down as tolerated.  Nausea  and vomiting Continue IV Zofran 4 mg every 6 hours as needed  Hypophosphatemia -Replacement ordered. Recent Labs  Lab 01/28/20 0601  PHOS 2.3*   Migraine headache Currently stable, continue Tylenol 650 mg p.o. every 6 hours.  Anxiety/depression -Home meds include multiple psychiatric medications and pain medications including Klonopin 2 mg 3 times daily, Neurontin 800 mg 3 times daily, Norco 10/325 3 times daily as needed, Dilaudid 4 mg 3 times daily as needed, Zoloft 100 mg daily, trazodone 100 mg at bedtime. -Unclear if patient is taking all of them on a regular basis. -Obtain EKG to watch for QTC  Mobility: Encourage ambulation Code Status:   Code Status: Full Code  Nutritional status: Body mass index is 27.81 kg/m.     Diet Order            Diet regular Room service appropriate? Yes; Fluid consistency: Thin  Diet effective now                 DVT prophylaxis: enoxaparin (LOVENOX) injection 40 mg Start: 01/28/20 1000 SCDs Start: 01/28/20 0415   Antimicrobials:  Doxycycline Fluid: None Consultants: None Family Communication:  None at bedside  Status is: Observation  The patient will require care spanning > 2 midnights and should be moved to inpatient because: Still on supplemental oxygen, lethargic, needs IV steroids and inpatient care  dispo: The patient is from: Home              Anticipated d/c is to: Home              Anticipated d/c date is: 2 days  Patient currently is not medically stable to d/c.       Infusions:    Scheduled Meds:  azithromycin  500 mg Oral Daily   Followed by   Melene Muller ON 01/29/2020] azithromycin  250 mg Oral Daily   dextromethorphan-guaiFENesin  1 tablet Oral BID   enoxaparin (LOVENOX) injection  40 mg Subcutaneous Daily   ipratropium-albuterol  3 mL Nebulization Q6H   methylPREDNISolone (SOLU-MEDROL) injection  40 mg Intravenous Q12H   pantoprazole  40 mg Oral Daily     Antimicrobials: Anti-infectives (From admission, onward)   Start     Dose/Rate Route Frequency Ordered Stop   01/29/20 1000  azithromycin (ZITHROMAX) tablet 250 mg       "Followed by" Linked Group Details   250 mg Oral Daily 01/28/20 0653 02/02/20 0959   01/28/20 1000  azithromycin (ZITHROMAX) tablet 500 mg       "Followed by" Linked Group Details   500 mg Oral Daily 01/28/20 0653 01/29/20 0959      PRN meds: acetaminophen, ipratropium-albuterol, ondansetron (ZOFRAN) IV   Objective: Vitals:   01/28/20 0600 01/28/20 0650  BP:    Pulse: (!) 102 (!) 108  Resp: 18 (!) 21  Temp:    SpO2: 93% 93%   No intake or output data in the 24 hours ending 01/28/20 0820 Filed Weights   01/27/20 2022  Weight: 71.2 kg   Weight change:  Body mass index is 27.81 kg/m.   Physical Exam: General exam: Physically tired Skin: No rashes, lesions or ulcers. HEENT: Atraumatic, normocephalic, supple neck, no obvious bleeding Lungs: Violent cough and deep breathing, no wheezing but scattered bronchial sounds CVS: Regular rate and rhythm, no murmur GI/Abd soft, nontender, nondistended, bowel sound present CNS: Opens eyes on verbal command, oriented x3  psychiatry: Depressed look Extremities: No pedal edema, no calf tenderness  Data Review: I have personally reviewed the laboratory data and studies available.  Recent Labs  Lab 01/27/20 2325  WBC 8.4  NEUTROABS 5.3  HGB 14.1  HCT 43.3  MCV 88.7  PLT 151   Recent Labs  Lab 01/27/20 2325 01/28/20 0601  NA 137  --   K 3.6  --   CL 103  --   CO2 25  --   GLUCOSE 103*  --   BUN 7  --   CREATININE 0.57  --   CALCIUM 9.0  --   MG  --  2.1  PHOS  --  2.3*    F/u labs ordered  Signed, Lorin Glass, MD [facility] 01/28/2020

## 2020-01-28 NOTE — ED Provider Notes (Signed)
  In brief, this is a 39 year old female with a history of asthma and smoking who presents with cough and shortness of breath.  Reports chills as well.  Initial concern for possible COVID-19 although Covid testing is negative.  She is significant wheezing on exam and hypoxic for EMS in the high 80s.  She is placed on 3 L of oxygen.  She is currently on 2 L of oxygen.  Disposition pending reassessment.  Physical Exam  BP 101/68   Pulse (!) 105   Temp 99.4 F (37.4 C) (Oral)   Resp 20   Ht 1.6 m (5\' 3" )   Wt 71.2 kg   LMP 01/23/2020   SpO2 92%   BMI 27.81 kg/m   Physical Exam  Ill-appearing but nontoxic Tachypneic and tachycardic with wheezing in all lung fields  ED Course/Procedures   Clinical Course as of Jan 27 326  Sun Jan 28, 2020  0155 Patient ambulated and maintained pulse ox but had significant tachypnea and shortness of breath.  Chart reviewed.  Has not yet received a nebulizer.  She is wheezing on exam.  Will give continuous nebulizer and reassess.   [CH]  0326 Patient remains tachypneic and significantly wheezing on reassessment after continuous DuoNeb.  She has received steroids.  Given hypoxia and continued respiratory symptoms, feel she would benefit from admission with frequent nebulizers.   [CH]    Clinical Course User Index [CH] Eyva Califano, 0156, MD    Procedures CRITICAL CARE Performed by: Mayer Masker   Total critical care time: 35 minutes  Critical care time was exclusive of separately billable procedures and treating other patients.  Critical care was necessary to treat or prevent imminent or life-threatening deterioration.  Critical care was time spent personally by me on the following activities: development of treatment plan with patient and/or surrogate as well as nursing, discussions with consultants, evaluation of patient's response to treatment, examination of patient, obtaining history from patient or surrogate, ordering and performing  treatments and interventions, ordering and review of laboratory studies, ordering and review of radiographic studies, pulse oximetry and re-evaluation of patient's condition.  MDM    Patient with shortness of breath.  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  Continues to have some respiratory distress and hypoxia despite appropriate treatment.  We will plan for admission.      Shon Baton, MD 01/28/20 [phone]

## 2020-01-28 NOTE — Progress Notes (Signed)
Patient takes neb by mouth piece only takes very shallow breaths. Started on incentive.

## 2020-01-29 ENCOUNTER — Other Ambulatory Visit: Payer: Self-pay

## 2020-01-29 ENCOUNTER — Encounter (HOSPITAL_COMMUNITY): Payer: Self-pay | Admitting: Internal Medicine

## 2020-01-29 LAB — MAGNESIUM: Magnesium: 2.3 mg/dL (ref 1.7–2.4)

## 2020-01-29 LAB — PHOSPHORUS: Phosphorus: 2.9 mg/dL (ref 2.5–4.6)

## 2020-01-29 LAB — COMPREHENSIVE METABOLIC PANEL
ALT: 12 U/L (ref 0–44)
AST: 14 U/L — ABNORMAL LOW (ref 15–41)
Albumin: 3.7 g/dL (ref 3.5–5.0)
Alkaline Phosphatase: 69 U/L (ref 38–126)
Anion gap: 9 (ref 5–15)
BUN: 17 mg/dL (ref 6–20)
CO2: 24 mmol/L (ref 22–32)
Calcium: 8.9 mg/dL (ref 8.9–10.3)
Chloride: 102 mmol/L (ref 98–111)
Creatinine, Ser: 0.65 mg/dL (ref 0.44–1.00)
GFR calc Af Amer: >60 mL/min (ref >60)
GFR calc non Af Amer: >60 mL/min (ref >60)
Glucose, Bld: 144 mg/dL — ABNORMAL HIGH (ref 70–99)
Potassium: 4.2 mmol/L (ref 3.5–5.1)
Sodium: 135 mmol/L (ref 135–145)
Total Bilirubin: 0.6 mg/dL (ref 0.3–1.2)
Total Protein: 7.1 g/dL (ref 6.5–8.1)

## 2020-01-29 LAB — CBC
HCT: 39.5 % (ref 36.0–46.0)
Hemoglobin: 12.8 g/dL (ref 12.0–15.0)
MCH: 28.7 pg (ref 26.0–34.0)
MCHC: 32.4 g/dL (ref 30.0–36.0)
MCV: 88.6 fL (ref 80.0–100.0)
Platelets: 158 10*3/uL (ref 150–400)
RBC: 4.46 MIL/uL (ref 3.87–5.11)
RDW: 15.1 % (ref 11.5–15.5)
WBC: 11.7 10*3/uL — ABNORMAL HIGH (ref 4.0–10.5)
nRBC: 0 % (ref 0.0–0.2)

## 2020-01-29 MED ORDER — ALPRAZOLAM 1 MG PO TABS
1.0000 mg | ORAL_TABLET | Freq: Three times a day (TID) | ORAL | Status: DC | PRN
  Administered 2020-01-29 – 2020-01-30 (×3): 1 mg via ORAL
  Filled 2020-01-29 (×3): qty 1

## 2020-01-29 MED ORDER — LIDOCAINE 5 % EX PTCH
1.0000 | MEDICATED_PATCH | CUTANEOUS | Status: DC
  Administered 2020-01-29 – 2020-01-30 (×2): 1 via TRANSDERMAL
  Filled 2020-01-29 (×2): qty 1

## 2020-01-29 MED ORDER — MIRTAZAPINE 15 MG PO TABS
45.0000 mg | ORAL_TABLET | Freq: Every day | ORAL | Status: DC
  Administered 2020-01-29: 45 mg via ORAL
  Filled 2020-01-29: qty 3

## 2020-01-29 MED ORDER — FLUTICASONE FUROATE-VILANTEROL 200-25 MCG/INH IN AEPB
1.0000 | INHALATION_SPRAY | Freq: Every day | RESPIRATORY_TRACT | Status: DC
  Administered 2020-01-29 – 2020-01-30 (×2): 1 via RESPIRATORY_TRACT
  Filled 2020-01-29: qty 28

## 2020-01-29 MED ORDER — SERTRALINE HCL 50 MG PO TABS
100.0000 mg | ORAL_TABLET | Freq: Every day | ORAL | Status: DC
  Administered 2020-01-29 – 2020-01-30 (×2): 100 mg via ORAL
  Filled 2020-01-29 (×2): qty 2

## 2020-01-29 MED ORDER — IPRATROPIUM-ALBUTEROL 0.5-2.5 (3) MG/3ML IN SOLN
3.0000 mL | Freq: Three times a day (TID) | RESPIRATORY_TRACT | Status: DC
  Administered 2020-01-30: 3 mL via RESPIRATORY_TRACT
  Filled 2020-01-29: qty 3

## 2020-01-29 NOTE — Progress Notes (Signed)
PROGRESS NOTE  [name]  DOB: Jul 30, 1980  PCP: Glennis Brink, DO (Inactive) [id number]  DOA: 01/27/2020  LOS: 1 day   Chief Complaint  Patient presents with  . Fever  . Generalized Body Aches  . Cough    Brief narrative: JESELLE Meza is a 39 y.o. female with PMH of chronic asthma, migraine, chronic daily smoking, hepatitis C, unvaccinated to Covid. Patient presented to the ED on 01/27/2020 with 3 to 4-day history of progressively worsening shortness of breath, cough, generalized body ache, nausea, vomiting, headache. Shortness of breath and wheezing did not improve with use of inhalers at home. EMS noted oxygen saturation to be in upper 80s and hence brought to the ED on nasal cannula.  In the ED, patient had a temperature of nine 9.4, heart rate between 100 and 110, oxygen saturation more than 90% on 3 L oxygen. Labs showed normal CBC, BMP. COVID-19 PCR negative. Chest x-ray did not show any acute disease. Patient was given IV Solu-Medrol, breathing treatment and admitted to hospitalist service for further evaluation and management.  Subjective: Patient was seen and examined this morning.  He still remains in ED because of inpatient bed not being available.  Continues to cough and shortness of breath.  Dependent on oxygen.  Diffuse bilateral bronchial sounds on examination.  Assessment/Plan: Acute respiratory failure with hypoxia Acute exacerbation of asthma -Presented with progressively worsening shortness of breath, cough, wheezing in the setting of chronic asthma -No infiltrate on chest x-ray. -Since admission, she has been getting Solu-Medrol IV 40 mg twice daily, doxycycline, duo nebs -However continues to cough, and has shortness of breath.  Dependent on oxygen.  Has diffuse bilateral bronchial sounds on examination. -Continue Protonix while on steroids -Encourage incentive spirometry and flutter valve. -Continue supplemental oxygen to  maintain more than 90% O2 sat.  Wean down as tolerated.  Nausea and vomiting Continue IV Zofran 4 mg every 6 hours as needed  Hypophosphatemia Phosphorus level was low at 2.3 yesterday. Replaced. Level improved today. Recent Labs  Lab 01/28/20 0601 01/29/20 0855  PHOS 2.3* 2.9   Migraine headache Chronic pain -Currently stable, continue Percocet as needed. -Patient states he has been taking Subutex at home daily.  Currently because of pain, she is on Percocet.  Planning to resume Subutex once he is discharged.  Anxiety/depression Chronic pain -Home meds include Xanax 1 mg 3 times daily, Remeron 45 mg at bedtime, Zoloft 100 mg daily.  -9/12, EKG with QTC 445 ms. -Resume meds.  Chronic everyday smoker -Continues to smoke half pack of cigarettes per day.  Aware of long-term risks of smoking.  At the same time, she has not taken Covid vaccination because she had concerns about its probably unknown long-term risks. -Counseled to quit smoking.  Counseled to get Covid vaccine.  Mobility: Encourage ambulation Code Status:   Code Status: Full Code  Nutritional status: Body mass index is 27.81 kg/m.     Diet Order            Diet regular Room service appropriate? Yes; Fluid consistency: Thin  Diet effective now                 DVT prophylaxis: enoxaparin (LOVENOX) injection 40 mg Start: 01/28/20 1000 SCDs Start: 01/28/20 0415   Antimicrobials:  Doxycycline Fluid: None  Consultants: None Family Communication:  None at bedside  Status is: Observation  The patient will require care spanning > 2 midnights and should be moved to inpatient because:  Still on supplemental oxygen, lethargic, needs IV steroids and inpatient care  dispo: The patient is from: Home              Anticipated d/c is to: Home              Anticipated d/c date is: 2 days              Patient currently is not medically stable to d/c.   Infusions:    Scheduled Meds: .  dextromethorphan-guaiFENesin  1 tablet Oral BID  . doxycycline  100 mg Oral Q12H  . enoxaparin (LOVENOX) injection  40 mg Subcutaneous Daily  . fluticasone furoate-vilanterol  1 puff Inhalation Daily  . ipratropium-albuterol  3 mL Nebulization Q6H  . lidocaine  1 patch Transdermal Q24H  . methylPREDNISolone (SOLU-MEDROL) injection  40 mg Intravenous Q12H  . mirtazapine  45 mg Oral QHS  . pantoprazole  40 mg Oral Daily  . sertraline  100 mg Oral Daily    Antimicrobials: Anti-infectives (From admission, onward)   Start     Dose/Rate Route Frequency Ordered Stop   01/29/20 1000  azithromycin (ZITHROMAX) tablet 250 mg  Status:  Discontinued       "Followed by" Linked Group Details   250 mg Oral Daily 01/28/20 0653 01/28/20 0835   01/28/20 1000  azithromycin (ZITHROMAX) tablet 500 mg  Status:  Discontinued       "Followed by" Linked Group Details   500 mg Oral Daily 01/28/20 0653 01/28/20 0835   01/28/20 1000  doxycycline (VIBRA-TABS) tablet 100 mg        100 mg Oral Every 12 hours 01/28/20 0835        PRN meds: acetaminophen, ALPRAZolam, HYDROcodone-acetaminophen, ipratropium-albuterol, ondansetron (ZOFRAN) IV   Objective: Vitals:   01/29/20 0630 01/29/20 0819  BP: 109/70   Pulse: 85   Resp: 17   Temp:    SpO2: 95% 95%   No intake or output data in the 24 hours ending 01/29/20 0930 Filed Weights   01/27/20 2022  Weight: 71.2 kg   Weight change:  Body mass index is 27.81 kg/m.   Physical Exam: General exam: Feels the same. And intermittent violent coughing. Also complained of chest pain. No coughing. Skin: No rashes, lesions or ulcers. HEENT: Atraumatic, normocephalic, supple neck, no obvious bleeding Lungs: Scattered bronchial sounds bilaterally. No wheezing. Coughs on deep breathing. CVS: Regular rate and rhythm, no murmur GI/Abd soft, nontender, nondistended, bowel sound present CNS: Opens eyes on verbal command, oriented x3  psychiatry: Depressed  look Extremities: No pedal edema, no calf tenderness  Data Review: I have personally reviewed the laboratory data and studies available.  Recent Labs  Lab 01/27/20 2325 01/29/20 0855  WBC 8.4 11.7*  NEUTROABS 5.3  --   HGB 14.1 12.8  HCT 43.3 39.5  MCV 88.7 88.6  PLT 151 158   Recent Labs  Lab 01/27/20 2325 01/28/20 0601 01/29/20 0855  NA 137  --  135  K 3.6  --  4.2  CL 103  --  102  CO2 25  --  24  GLUCOSE 103*  --  144*  BUN 7  --  17  CREATININE 0.57  --  0.65  CALCIUM 9.0  --  8.9  MG  --  2.1 2.3  PHOS  --  2.3* 2.9    F/u labs ordered  Signed, Lorin Glass, MD [facility] 01/29/2020

## 2020-01-29 NOTE — ED Notes (Signed)
Vital signs stable. 

## 2020-01-30 MED ORDER — DM-GUAIFENESIN ER 30-600 MG PO TB12: 1.0000 | ORAL_TABLET | Freq: Two times a day (BID) | ORAL | 0 refills | Status: AC

## 2020-01-30 MED ORDER — LIDOCAINE 5 % EX PTCH: 1.0000 | MEDICATED_PATCH | CUTANEOUS | 0 refills | Status: AC

## 2020-01-30 MED ORDER — DOXYCYCLINE HYCLATE 100 MG PO TABS: 100.0000 mg | ORAL_TABLET | Freq: Two times a day (BID) | ORAL | 0 refills | Status: AC

## 2020-01-30 MED ORDER — PREDNISONE 10 MG PO TABS: ORAL_TABLET | ORAL | 0 refills | Status: AC

## 2020-01-30 NOTE — Discharge Summary (Signed)
Physician Discharge Summary  [name] GEZ:[id number] DOB: October 22, 1980 DOA: 01/27/2020  PCP: Glennis Brink, DO (Inactive)  Admit date: 01/27/2020 Discharge date: 01/30/2020  Admitted From: Home Discharge disposition: home    Code Status: Full Code  Diet Recommendation: Regular diet  Discharge Diagnosis:   Principal Problem:   Asthma exacerbation Active Problems:   Migraine headache   NAUSEA AND VOMITING   Acute respiratory failure with hypoxia (HCC)  History of Present Illness / Brief narrative:  [name] is a 39 y.o. Meza with PMH of chronic asthma, migraine, chronic daily smoking, hepatitis C, unvaccinated to Covid. Patient presented to the ED on 01/27/2020 with 3 to 4-day history of progressively worsening shortness of breath, cough, generalized body ache, nausea, vomiting, headache. Shortness of breath and wheezing did not improve with use of inhalers at home. EMS noted oxygen saturation to be in upper 80s and hence brought to the ED on nasal cannula.  In the ED, patient had a temperature of nine 9.4, heart rate between 100 and 110, oxygen saturation more than 90% on 3 L oxygen. Labs showed normal CBC, BMP. COVID-19 PCR negative. Chest x-ray did not show any acute disease. Patient was given IV Solu-Medrol, breathing treatment and admitted to hospitalist service for further evaluation and management.  Subjective:  Seen and examined this morning.  [name].  Not in distress.  Able to ambulate in the hallway today without oxygen.  Hospital Course:  Acute respiratory failure with hypoxia Acute exacerbation of asthma -Presented with progressively worsening shortness of breath, cough, wheezing in the setting of chronic asthma -No infiltrate on chest x-ray. -Since admission, she has been getting Solu-Medrol IV 40 mg twice daily, doxycycline, duo nebs -Cough improving.  Shortness of breath improving. She has some improvement in  her diffuse bilateral bronchial breath sounds.  Able to ambulate in the hallway today without supplemental oxygen and maintaining oxygen saturation more than 90%. -Plan to discharge home today on tapering course of prednisone, 3 more days of doxycycline and continuation of inhalers. -Continue incentive spirometry.  COPD -Although patient does not have an established diagnosis of COPD.  She is a lifelong smoker.  Has diffuse bilateral bronchial sounds with end expiratory wheezing.  -She will follow up with pulmonology as an outpatient for pulmonary function test.  Hypophosphatemia Phosphorus level was low at 2.3 on admission.  Replaced.  Level improved. Recent Labs  Lab 01/28/20 0601 01/29/20 0855  PHOS 2.3* 2.9   Migraine headache Chronic pain -Currently stable, continue Percocet as needed. -Patient states he has been taking Subutex at home daily.  Currently because of pain, she is on Percocet.  Planning to resume Subutex once he is discharged.  Anxiety/depression Chronic pain -Home meds include Xanax 1 mg 3 times daily, Remeron 45 mg at bedtime, Zoloft 100 mg daily.  -9/12, EKG with QTC 445 ms. -Continue same meds.  Chronic everyday smoker -Continues to smoke half pack of cigarettes per day.  Aware of long-term risks of smoking.  At the same time, she has not taken Covid vaccination because she had concerns about its probably unknown long-term risks. -Counseled to quit smoking.  Counseled to get Covid vaccine.  Stable for discharge to home today. Wound care:    Discharge Exam:   Vitals:   01/30/20 0825 01/30/20 0828 01/30/20 1040 01/30/20 1115  BP:      Pulse:      Resp:      Temp:  TempSrc:      SpO2: 96% 100% 95% 94%  Weight:      Height:        Body mass index is 27.81 kg/m.  General exam: Appears calm and comfortable.  Not in physical distress Skin: No rashes, lesions or ulcers. HEENT: Atraumatic, normocephalic, supple neck, no obvious  bleeding Lungs: Mild diffuse bronchial breath sound bilaterally, without wheezing. CVS: Regular rate and rhythm, no murmur GI/Abd soft, nontender, nondistended, bowel sound present CNS: Alert, awake, oriented times 3 Psychiatry: Mood appropriate Extremities: No pedal edema, no calf tenderness  Follow ups:   Discharge Instructions    Ambulatory referral to Pulmonology   Complete by: As directed    Reason for referral: Asthma/COPD   Increase activity slowly   Complete by: As directed       Follow-up Information    Bowen, Scot JunKaren E, DO Follow up.   Specialty: Family Medicine Contact information: [address] [place] [id number] [phone]               Recommendations for Outpatient Follow-Up:   1. Follow-up with PCP as an outpatient 2. Pulmonary referral given  Discharge Instructions:  Follow with Primary MD Bowen, Scot JunKaren E, DO (Inactive) in 7 days   Get CBC/BMP checked in next visit within 1 week by PCP or SNF MD ( we routinely change or add medications that can affect your baseline labs and fluid status, therefore we recommend that you get the mentioned basic workup next visit with your PCP, your PCP may decide not to get them or add new tests based on their clinical decision)  On your next visit with your PCP, please Get Medicines reviewed and adjusted.  Please request your PCP  to go over all Hospital Tests and Procedure/Radiological results at the follow up, please get all Hospital records sent to your Prim MD by signing hospital release before you go home.  Activity: As tolerated with Full fall precautions use walker/cane & assistance as needed  For Heart failure patients - Check your Weight same time everyday, if you gain over 2 pounds, or you develop in leg swelling, experience more shortness of breath or chest pain, call your Primary MD immediately. Follow Cardiac Low Salt Diet and 1.5 lit/day fluid restriction.  If you have smoked or chewed Tobacco  in the last 2 yrs please stop smoking, stop any regular Alcohol  and or any Recreational drug use.  If you experience worsening of your admission symptoms, develop shortness of breath, life threatening emergency, suicidal or homicidal thoughts you must seek medical attention immediately by calling 911 or calling your MD immediately  if symptoms less severe.  You Must read complete instructions/literature along with all the possible adverse reactions/side effects for all the Medicines you take and that have been prescribed to you. Take any new Medicines after you have completely understood and accpet all the possible adverse reactions/side effects.   Do not drive, operate heavy machinery, perform activities at heights, swimming or participation in water activities or provide baby sitting services if your were admitted for syncope or siezures until you have seen by Primary MD or a Neurologist and advised to do so again.  Do not drive when taking Pain medications.  Do not take more than prescribed Pain, Sleep and Anxiety Medications  Wear Seat belts while driving.   Please note You were cared for by a hospitalist during your hospital stay. If you have any questions about your discharge medications  or the care you received while you were in the hospital after you are discharged, you can call the unit and asked to speak with the hospitalist on call if the hospitalist that took care of you is not available. Once you are discharged, your primary care physician will handle any further medical issues. Please note that NO REFILLS for any discharge medications will be authorized once you are discharged, as it is imperative that you return to your primary care physician (or establish a relationship with a primary care physician if you do not have one) for your aftercare needs so that they can reassess your need for medications and monitor your lab values.    Allergies as of 01/30/2020      Reactions   Bactrim     Caused "bleeding in my urine"   Clindamycin/lincomycin Other (See Comments)   "peeing blood-needed blood transfusion"   Darvocet [propoxyphene N-acetaminophen] Nausea And Vomiting   Toradol [ketorolac Tromethamine] Hives      Medication List    TAKE these medications   albuterol (2.5 MG/3ML) 0.083% nebulizer solution Commonly known as: PROVENTIL Take 2.5 mg by nebulization 3 (three) times daily as needed. Shortness of breath and wheezing   ALPRAZolam 1 MG tablet Commonly known as: XANAX Take 1 mg by mouth in the morning, at noon, and at bedtime.   budesonide-formoterol 160-4.5 MCG/ACT inhaler Commonly known as: SYMBICORT Inhale 2 puffs into the lungs 2 (two) times daily as needed (for shortness of breath).   dextromethorphan-guaiFENesin 30-600 MG 12hr tablet Commonly known as: MUCINEX DM Take 1 tablet by mouth 2 (two) times daily for 10 days.   doxycycline 100 MG tablet Commonly known as: VIBRA-TABS Take 1 tablet (100 mg total) by mouth every 12 (twelve) hours for 3 days.   HYDROmorphone 4 MG tablet Commonly known as: DILAUDID Take 4 mg by mouth 3 (three) times daily as needed for moderate pain or severe pain.   lidocaine 5 % Commonly known as: LIDODERM Place 1 patch onto the skin daily for 7 days. Remove & Discard patch within 12 hours or as directed by MD Start taking on: January 31, 2020   mirtazapine 45 MG tablet Commonly known as: REMERON Take 45 mg by mouth at bedtime.   predniSONE 10 MG tablet Commonly known as: DELTASONE Take 4 tablets (40 mg) daily for 2 days, then, Take 3 tablets (30 mg) daily for 2 days, then, Take 2 tablets (20 mg) daily for 2 days, then, Take 1 tablets (10 mg) daily for 1 days, then stop.   sertraline 100 MG tablet Commonly known as: ZOLOFT Take 100 mg by mouth daily.       Time coordinating discharge: 35 minutes  The results of significant diagnostics from this hospitalization (including imaging, microbiology, ancillary and  laboratory) are listed below for reference.    Procedures and Diagnostic Studies:   DG Chest 2 View  Result Date: 01/27/2020 CLINICAL DATA:  Fever, body aches and chills with cough x4 days. EXAM: CHEST - 2 VIEW COMPARISON:  January 18, 2011 FINDINGS: There is no evidence of acute infiltrate, pleural effusion or pneumothorax. The heart size and mediastinal contours are within normal limits. The visualized skeletal structures are unremarkable. Radiopaque surgical clips are seen overlying the right upper quadrant. IMPRESSION: No active cardiopulmonary disease. Electronically Signed   By: Aram Candela M.D.   On: 01/27/2020 23:03     Labs:   Basic Metabolic Panel: Recent Labs  Lab 01/27/20 2325 01/28/20 0601 01/29/20  0855  NA 137  --  135  K 3.6  --  4.2  CL 103  --  102  CO2 25  --  24  GLUCOSE 103*  --  144*  BUN 7  --  17  CREATININE 0.57  --  0.65  CALCIUM 9.0  --  8.9  MG  --  2.1 2.3  PHOS  --  2.3* 2.9   GFR Estimated Creatinine Clearance: 88.4 mL/min (by C-G formula based on SCr of 0.65 mg/dL). Liver Function Tests: Recent Labs  Lab 01/29/20 0855  AST 14*  ALT 12  ALKPHOS 69  BILITOT 0.6  PROT 7.1  ALBUMIN 3.7   No results for input(s): LIPASE, AMYLASE in the last 168 hours. No results for input(s): AMMONIA in the last 168 hours. Coagulation profile Recent Labs  Lab 01/28/20 0601  INR 1.1    CBC: Recent Labs  Lab 01/27/20 2325 01/29/20 0855  WBC 8.4 11.7*  NEUTROABS 5.3  --   HGB 14.1 12.8  HCT 43.3 39.5  MCV 88.7 88.6  PLT 151 158   Cardiac Enzymes: No results for input(s): CKTOTAL, CKMB, CKMBINDEX, TROPONINI in the last 168 hours. BNP: Invalid input(s): POCBNP CBG: No results for input(s): GLUCAP in the last 168 hours. D-Dimer No results for input(s): DDIMER in the last 72 hours. Hgb A1c No results for input(s): HGBA1C in the last 72 hours. Lipid Profile No results for input(s): CHOL, HDL, LDLCALC, TRIG, CHOLHDL, LDLDIRECT in the  last 72 hours. Thyroid function studies No results for input(s): TSH, T4TOTAL, T3FREE, THYROIDAB in the last 72 hours.  Invalid input(s): FREET3 Anemia work up No results for input(s): VITAMINB12, FOLATE, FERRITIN, TIBC, IRON, RETICCTPCT in the last 72 hours. Microbiology Recent Results (from the past 240 hour(s))  SARS Coronavirus 2 by RT PCR (hospital order, performed in [facility] hospital lab) Nasopharyngeal Nasopharyngeal Swab     Status: None   Collection Time: 01/27/20  8:30 PM   Specimen: Nasopharyngeal Swab  Result Value Ref Range Status   SARS Coronavirus 2 NEGATIVE NEGATIVE Final    Comment: (NOTE) SARS-CoV-2 target nucleic acids are NOT DETECTED.  The SARS-CoV-2 RNA is generally detectable in upper and lower respiratory specimens during the acute phase of infection. The lowest concentration of SARS-CoV-2 viral copies this assay can detect is 250 copies / mL. A negative result does not preclude SARS-CoV-2 infection and should not be used as the sole basis for treatment or other patient management decisions.  A negative result may occur with improper specimen collection / handling, submission of specimen other than nasopharyngeal swab, presence of viral mutation(s) within the areas targeted by this assay, and inadequate number of viral copies (<250 copies / mL). A negative result must be combined with clinical observations, patient history, and epidemiological information.  Fact Sheet for Patients:   BoilerBrush.com.cy  Fact Sheet for Healthcare Providers: https://pope.com/  This test is not yet approved or  cleared by the [place] FDA and has been authorized for detection and/or diagnosis of SARS-CoV-2 by FDA under an Emergency Use Authorization (EUA).  This EUA will remain in effect (meaning this test can be used) for the duration of the COVID-19 declaration under Section 564(b)(1) of the Act, 21 U.S.C. section  360bbb-3(b)(1), unless the authorization is terminated or revoked sooner.  Performed at [facility], [address], [place], [place] [address]   Culture, blood (routine x 2)     Status: None (Preliminary result)   Collection Time:  01/27/20 11:25 PM   Specimen: Right Antecubital; Blood  Result Value Ref Range Status   Specimen Description RIGHT ANTECUBITAL  Final   Special Requests   Final    BOTTLES DRAWN AEROBIC AND ANAEROBIC Blood Culture adequate volume   Culture   Final    NO GROWTH 2 DAYS Performed at [facility], [address] [place], [place] [address]    Report Status PENDING  Incomplete  Culture, blood (routine x 2)     Status: None (Preliminary result)   Collection Time: 01/27/20 11:32 PM   Specimen: BLOOD RIGHT HAND  Result Value Ref Range Status   Specimen Description BLOOD RIGHT HAND  Final   Special Requests   Final    BOTTLES DRAWN AEROBIC AND ANAEROBIC Blood Culture adequate volume   Culture   Final    NO GROWTH 2 DAYS Performed at [facility], [address], [place], [place] [address]    Report Status PENDING  Incomplete  Respiratory Panel by PCR     Status: None   Collection Time: 01/28/20 12:33 AM   Specimen: Nasopharyngeal Swab; Respiratory  Result Value Ref Range Status   Adenovirus NOT DETECTED NOT DETECTED Final   Coronavirus 229E NOT DETECTED NOT DETECTED Final    Comment: (NOTE) The Coronavirus on the Respiratory Panel, DOES NOT test for the novel  Coronavirus (2019 nCoV)    Coronavirus HKU1 NOT DETECTED NOT DETECTED Final   Coronavirus NL63 NOT DETECTED NOT DETECTED Final   Coronavirus OC43 NOT DETECTED NOT DETECTED Final   Metapneumovirus NOT DETECTED NOT DETECTED Final   Rhinovirus / Enterovirus NOT DETECTED NOT DETECTED Final   Influenza A NOT DETECTED NOT DETECTED Final   Influenza B NOT DETECTED NOT DETECTED Final   Parainfluenza Virus 1 NOT DETECTED NOT DETECTED Final   Parainfluenza Virus 2 NOT DETECTED NOT DETECTED Final    Parainfluenza Virus 3 NOT DETECTED NOT DETECTED Final   Parainfluenza Virus 4 NOT DETECTED NOT DETECTED Final   Respiratory Syncytial Virus NOT DETECTED NOT DETECTED Final   Bordetella pertussis NOT DETECTED NOT DETECTED Final   Chlamydophila pneumoniae NOT DETECTED NOT DETECTED Final   Mycoplasma pneumoniae NOT DETECTED NOT DETECTED Final    Comment: Performed at [facility], [address], [place], [place] [address]     Signed: Lorin Glass  [facility] 01/30/2020, 11:38 AM

## 2020-01-30 NOTE — Progress Notes (Signed)
Nsg Discharge Note  Admit Date:  01/27/2020 Discharge date: 01/30/2020   [name] to be D/C'd Home per MD order.  AVS completed.  Copy for chart, and copy for patient signed, and dated. Patient/caregiver able to verbalize understanding.  Discharge Medication: Allergies as of 01/30/2020      Reactions   Bactrim    Caused "bleeding in my urine"   Clindamycin/lincomycin Other (See Comments)   "peeing blood-needed blood transfusion"   Darvocet [propoxyphene N-acetaminophen] Nausea And Vomiting   Toradol [ketorolac Tromethamine] Hives      Medication List    TAKE these medications   albuterol (2.5 MG/3ML) 0.083% nebulizer solution Commonly known as: PROVENTIL Take 2.5 mg by nebulization 3 (three) times daily as needed. Shortness of breath and wheezing   ALPRAZolam 1 MG tablet Commonly known as: XANAX Take 1 mg by mouth in the morning, at noon, and at bedtime.   budesonide-formoterol 160-4.5 MCG/ACT inhaler Commonly known as: SYMBICORT Inhale 2 puffs into the lungs 2 (two) times daily as needed (for shortness of breath).   dextromethorphan-guaiFENesin 30-600 MG 12hr tablet Commonly known as: MUCINEX DM Take 1 tablet by mouth 2 (two) times daily for 10 days.   doxycycline 100 MG tablet Commonly known as: VIBRA-TABS Take 1 tablet (100 mg total) by mouth every 12 (twelve) hours for 3 days.   HYDROmorphone 4 MG tablet Commonly known as: DILAUDID Take 4 mg by mouth 3 (three) times daily as needed for moderate pain or severe pain.   lidocaine 5 % Commonly known as: LIDODERM Place 1 patch onto the skin daily for 7 days. Remove & Discard patch within 12 hours or as directed by MD Start taking on: January 31, 2020   mirtazapine 45 MG tablet Commonly known as: REMERON Take 45 mg by mouth at bedtime.   predniSONE 10 MG tablet Commonly known as: DELTASONE Take 4 tablets (40 mg) daily for 2 days, then, Take 3 tablets (30 mg) daily for 2 days, then, Take 2 tablets  (20 mg) daily for 2 days, then, Take 1 tablets (10 mg) daily for 1 days, then stop.   sertraline 100 MG tablet Commonly known as: ZOLOFT Take 100 mg by mouth daily.       Discharge Assessment: Vitals:   01/30/20 1040 01/30/20 1115  BP:    Pulse:    Resp:    Temp:    SpO2: 95% 94%   Skin clean, dry and intact without evidence of skin break down, no evidence of skin tears noted. IV catheter discontinued intact. Site without signs and symptoms of complications - no redness or edema noted at insertion site, patient denies c/o pain - only slight tenderness at site.  Dressing with slight pressure applied.  D/c Instructions-Education: Discharge instructions given to patient/family with verbalized understanding. D/c education completed with patient/family including follow up instructions, medication list, d/c activities limitations if indicated, with other d/c instructions as indicated by MD - patient able to verbalize understanding, all questions fully answered. Patient instructed to return to ED, call 911, or call MD for any changes in condition.  Patient escorted via WC, and D/C home via private auto.  Brandy Hale, LPN 11/11/348 09:38 PM

## 2020-02-02 LAB — CULTURE, BLOOD (ROUTINE X 2)
Culture: NO GROWTH
Culture: NO GROWTH
Special Requests: ADEQUATE
Special Requests: ADEQUATE

## 2024-03-17 NOTE — Progress Notes (Signed)
 [facility] OLEN BRING MD ----------------------------------------------------------------------------------------------------------------------------------------------------------------------------------------------------------  Name : [name] DOB : 05-14-1981 MRN : [id number]  PCP : Oleh MALVA Punch, MD   Interval History:  03/17/2024 Patient seen for hospital follow-up. Patient reports being hospitalized recently at [facility].  Reviewed records from recent hospitalization.  Patient reports being treated with doxycycline /prednisone  twice.  She was noted to have abnormal imaging and advised to follow-up in our clinic.  She has not been seen since April.  Patient reports being compliant with Symbicort.  Reports using DuoNebs.  She reports being on prednisone  3-4 times in the last year.  She is smoking 2 to 3 cigarettes a day.  She reports having cough productive of clear phlegm.  She is on 3 L of oxygen 24/7.  She reports dyspnea with ADLs. mMRC : 4 Have you been more short of breath- yes Have you coughed more than usual - yes Have you coughed up more mucus than usual- no Have you coughed up blood - no Have you been awakened by your breathing - yes Have you been to the ER or urgent care or sick visits since prior clinic visit - yes How many times in the past 1 year have you taken steroids or antibiotics for your breathing - 3-4 How often do you use your rescue inhaler - daily What medicines are you taking for your breathing - symbicort and albuterol  Are you having any problems with your inhalers or nebulizers - no Do you use any nebulizers and how often do you use them - yes, daily sometimes 2 times a day Do you use oxygen, if yes when - yes, all the time 3L Do you use CPAP/BiPAP- no How many feet can you walk on level ground before you get out of breath today- unsure Do you get short of breath changing clothes or taking a shower -  yes    08/17/2023 Patient seen for follow-up of PFT. Patient reports she continues to get out of breath with more than usual exertion.  She reports getting dyspneic with ADLs.  Reports having nonproductive cough.  Patient reports compliance with Symbicort.  Reports using nebulizers as needed.  She is on oxygen at 3 L.  She sleeps with oxygen at night.  Patient continues to smoke close to a pack per week.  08/03/2023 Patient seen for hospital follow-up. Patient reports being hospitalized at [facility] in early February for COPD exacerbation.  Reviewed records from recent hospitalization.  Patient was treated with bronchodilators/steroids.  She was discharged home on cefdinir/doxycycline /prednisone  taper.  Patient was discharged home on oxygen.  She has been using 3 to 4 L of oxygen.  Patient reports she continues to smoke.  A pack of cigarettes lasts her a week.  She reports having cough productive of clear phlegm.  Denies any hemoptysis.  Reports compliance with Symbicort.  Uses rescue inhaler/nebs couple of times during the day.  Patient does report dyspnea with ADLs. RPP negative for COVID/flu during hospital stay.  Patient reports son being sick with flu week prior to her going to the hospital.  NT proBNP during hospital stay.  02/23/2023 Patient seen for follow-up. Patient reports she has been coughing up yellowish-green phlegm for the last 1 week.  Denies any recent known sick contacts.  Denies any fever, chills, night sweats.  She continues to smoke close to half a pack per day.  Patient reports compliance with Symbicort.  Reports using rescue inhaler few times a day  recently.  She has not tested herself for COVID.  Patient follows up with psychiatry and is interested in getting evaluated for sleep apnea. She reports snoring and nonrestful sleep.   08/2022 Patient seen for follow-up of dyspnea.  Patient has not been seen in clinic since June last year.  Patient reports she has done  well from a pulmonary standpoint.  Patient reports being evaluated for her back recently and is scheduled to undergo another back surgery.  Patient thinks she will be under anesthesia for more than 4 hours.  Patient reports she has been using Symbicort twice daily.  Reports using her rescue inhaler/nebulizer 1-2 times a day.  Patient reports getting dyspneic with ADLs at times.  Denies having any productive cough, fever, chills, night sweats. Patient continues to smoke close to half a pack per day.  PMH/PSH/FH/SH : Reviewed  Current Outpatient Medications:  .  ABILIFY MAINTENA 400 MG injection, Inject four hundred mg into the muscle every 4 (four) weeks., Disp: , Rfl:  .  ALBUTEROL  SULFATE HFA IN, Inhale 2 puffs into the lungs every 6 (six) hours as needed for Wheezing., Disp: , Rfl:  .  ALPRAZolam  (XANAX ) 1 mg tablet, Take one tablet (1 mg dose) by mouth., Disp: , Rfl:  .  BELSOMRA 20 MG TABS, Take one tablet (20 mg dose) by mouth at bedtime., Disp: , Rfl:  .  budesonide-formoterol (SYMBICORT) 80-4.5 mcg/actuation inhaler, Inhale two puffs into the lungs 2 (two) times daily., Disp: , Rfl:  .  buprenorphine (SUBUTEX) 8 mg SUBL, Place one tablet (8 mg dose) under the tongue 3 (three) times a day., Disp: , Rfl:  .  gabapentin (NEURONTIN) 600 mg tablet, Take one tablet (600 mg dose) by mouth 3 (three) times a day., Disp: , Rfl:  .  ipratropium-albuterol  (DUONEB) 0.5-2.5 mg/3 mL ML SOLN nebulizer solution, Take 3 mLs by nebulization 4 (four) times daily., Disp: 360 mL, Rfl: 0 .  lamoTRIgine (LAMICTAL) 150 mg tablet, Take one tablet (150 mg dose) by mouth 2 (two) times daily., Disp: , Rfl:  .  mirtazapine  (REMERON ) 30 mg tablet, Take one tablet (30 mg dose) by mouth at bedtime., Disp: , Rfl:  .  nicotine (HABITROL,NICODERM CQ) 21 mg/24 hours, Place one patch onto the skin daily., Disp: , Rfl:  .  omeprazole (PRILOSEC) 20 mg capsule, Take one capsule (20 mg dose) by mouth daily., Disp: , Rfl:  .   ondansetron  (ZOFRAN -ODT) 8 mg disintegrating tablet, Take one tablet (8 mg dose) by mouth every 8 (eight) hours as needed., Disp: , Rfl:  .  polyethylene glycol (MIRALAX,GAVILAX,CLEARLAX) 17 GM/SCOOP powder, Take 120 mLs (17 g dose) by mouth daily., Disp: , Rfl:  .  prazosin HCl (MINIPRESS) 5 mg capsule, Take one capsule (5 mg dose) by mouth at bedtime., Disp: , Rfl:  .  promethazine (PHENERGAN) 25 MG tablet, 25 mg per, ORAL, q6hr, PRN PRN as needed for nausea/vomiting, 3 day(s), 12 tablet, 0 Refill(s), Pharmacy: [facility], Height, 160.02, cm, 11/18/20 16:03:00 EDT, Weight, 83, kg, 11/18/20 16:03:00 EDT, Disp: , Rfl:  .  rizatriptan (MAXALT) 10 MG tablet, SMARTSIG:1 By Mouth Daily, Disp: , Rfl:  .  tiZANidine (ZANAFLEX) 4 mg tablet, Take one tablet (4 mg dose) by mouth at bedtime., Disp: , Rfl:  .  umeclidinium bromide (INCRUSE ELLIPTA) 62.5 mcg/inh inhalation powder, Inhale one puff into the lungs daily., Disp: 1 each, Rfl: 2  Allergies  Allergen Reactions  . Bactrim [Sulfamethoxazole-Trimethoprim] Bleeding  Blood transfusion  . Clindamycin/Lincomycin Bleeding    Blood transfusion  . Acetaminophen  Nausea And Vomiting  . Clindamycin Hives    Other reaction(s): bleeding   . Ibuprofen  Nausea And Vomiting and Other    Other reaction(s): Unknown   . Sulfamethoxazole W/Trimethoprim Other  . Toradol  Hives  . Tramadol Nausea And Vomiting and Rash    Other reaction(s): Vomiting (intolerance) Other reaction(s): Vomiting (intolerance)       BRCA related family history is not on file.  Vitals:   03/17/24 1212  BP: 121/82  Pulse: 89  Resp: 16  Temp: 97.4 F (36.3 C)  SpO2: 95%     Physical Exam Vitals and nursing note reviewed. Exam conducted with a chaperone present.  Constitutional:      Appearance: Normal appearance.  HENT:     Head: Normocephalic and atraumatic.     Nose: Nose normal.     Mouth/Throat:     Mouth: Mucous membranes are moist.  Eyes:      Extraocular Movements: Extraocular movements intact.     Pupils: Pupils are equal, round, and reactive to light.  Cardiovascular:     Rate and Rhythm: Normal rate and regular rhythm.     Pulses: Normal pulses.  Pulmonary:     Effort: Pulmonary effort is normal.  Abdominal:     Palpations: Abdomen is soft.  Musculoskeletal:        General: Normal range of motion.     Cervical back: Neck supple.  Skin:    General: Skin is warm and dry.  Neurological:     General: No focal deficit present.     Mental Status: She is alert and oriented to person, place, and time.  Psychiatric:        Mood and Affect: Mood normal.        Behavior: Behavior normal.    Pulmonary Function Testing: Reviewed PFT 07/2023 FEV1L: 1.54 FEV1 %/Predicted: 54 FVC L: 2.29 FVC % Predicted: 66 FEV1/FVC: 68.94 % Change: 6 TLC % Predicted: 103 DLCO Corr % Predicted: 70  Spirometry 02/2024 PFT FEV1L: 1 FEV1 %/Predicted: 35 FVC L: 1.66 FVC % Predicted: 47 FEV1/FVC: 0.6  Scores 02/2024 CAT: 35 ACT: 8   6 Minute Walk Test 07/2023 What is the resting SaO2?: 90 % Were any assistive devices used?: none Total distanced walked (feet): 616 feet Test stopped prior to 6 minutes?: No What was the SaO2 after intervention?: 92 % Was O2 required to maintain SaO2 above 92% during exercise?: 2 L/min  Sleep Study 09/2021 : AHI<5.   CT chest 04/2021 [facility] 1. No evidence of acute intrathoracic disease. Minimal ground-glass opacities in the anterolateral right middle lobe thought to be sequela of previous pneumonitis/coronavirus infection.   Chest x-ray 06/2023 [facility] portable chest.   CT Chest 02/2024 [facility] 1. Mild airway thickening and patchy areas of ground-glass attenuation within the upper lobes bilaterally, nonspecific but favored to be related to infectious/inflammatory change.    ASSESSMENT/PLAN Chronic hypoxic respiratory failure secondary to chronic bronchitis Spirometry done in  clinic today reviewed with patient.  Significant drop in FEV1 as compared to prior testing. Continue Symbicort 160.  Start Incruse.  Albuterol  as needed. Continue oxygen to maintain saturation above 89%. Review of labs does show peripheral eosinophilia in the past.  AEC 400 in April 2025. She might be a candidate for Biologics but we will hold off at this time.  Abnormal CT of the chest CT chest done recently reviewed independently.  Left  upper lobe ground glass.  Recently treated with antibiotics/steroids.  Repeat CT chest to document resolution. Negative asp precipitins. Normal IgE  History of COVID-19  Cirrhosis/history of hepatitis C  Chronic allergic rhinitis Normal IgE. Nasal steroids and oral antihistamine as needed.   Smoker Patient continues to smoke.  She is smoking 2 to 3 cigarettes a day. Patient was encouraged again in clinic today to quit smoking.  Records reviewed in Care Everywhere. Documentation for time-based billing:  Total time spent of date of service was 45 minutes.  Patient care activities included preparing to see the patient such as reviewing the patient record, obtaining and/or reviewing separately obtained history, performing a medically appropriate history and physical examination, counseling and educating the patient, family, and/or caregiver, ordering prescription medications, tests, or procedures, referring and communicating with other health care providers when not separately reported during the visit, documenting clinical information in the electronic or other health record, independently interpreting results when not separately reported, communicating results to the patient/family/caregiver, and coordinating the care of the patient when not separately reported.      Follow up in about 6 weeks (around 04/28/2024) for CT CHEST.  Olen Bring, MD [facility] Pulmonary

## 2024-04-12 ENCOUNTER — Emergency Department (HOSPITAL_COMMUNITY): Payer: MEDICAID

## 2024-04-12 ENCOUNTER — Emergency Department (HOSPITAL_COMMUNITY)
Admission: EM | Admit: 2024-04-12 | Discharge: 2024-04-12 | Disposition: A | Discharge status: Home / Self Care | Payer: MEDICAID | Attending: Student in an Organized Health Care Education/Training Program | Admitting: Student in an Organized Health Care Education/Training Program

## 2024-04-12 ENCOUNTER — Encounter (HOSPITAL_COMMUNITY): Payer: Self-pay | Admitting: Emergency Medicine

## 2024-04-12 ENCOUNTER — Other Ambulatory Visit: Payer: Self-pay

## 2024-04-12 DIAGNOSIS — S8012XA Contusion of left lower leg, initial encounter: Secondary | ICD-10-CM | POA: Diagnosis not present

## 2024-04-12 DIAGNOSIS — Z79899 Other long term (current) drug therapy: Secondary | ICD-10-CM | POA: Diagnosis not present

## 2024-04-12 DIAGNOSIS — R000 Tachycardia, unspecified: Secondary | ICD-10-CM | POA: Diagnosis not present

## 2024-04-12 DIAGNOSIS — M25511 Pain in right shoulder: Secondary | ICD-10-CM | POA: Diagnosis not present

## 2024-04-12 DIAGNOSIS — Z7951 Long term (current) use of inhaled steroids: Secondary | ICD-10-CM | POA: Insufficient documentation

## 2024-04-12 DIAGNOSIS — J449 Chronic obstructive pulmonary disease, unspecified: Secondary | ICD-10-CM | POA: Insufficient documentation

## 2024-04-12 DIAGNOSIS — R140 Abdominal distension (gaseous): Secondary | ICD-10-CM | POA: Insufficient documentation

## 2024-04-12 DIAGNOSIS — M25512 Pain in left shoulder: Secondary | ICD-10-CM | POA: Insufficient documentation

## 2024-04-12 DIAGNOSIS — G8929 Other chronic pain: Secondary | ICD-10-CM | POA: Diagnosis not present

## 2024-04-12 DIAGNOSIS — W01198A Fall on same level from slipping, tripping and stumbling with subsequent striking against other object, initial encounter: Secondary | ICD-10-CM | POA: Diagnosis not present

## 2024-04-12 DIAGNOSIS — F17200 Nicotine dependence, unspecified, uncomplicated: Secondary | ICD-10-CM | POA: Insufficient documentation

## 2024-04-12 DIAGNOSIS — M545 Low back pain, unspecified: Secondary | ICD-10-CM | POA: Diagnosis not present

## 2024-04-12 DIAGNOSIS — S8992XA Unspecified injury of left lower leg, initial encounter: Secondary | ICD-10-CM | POA: Diagnosis present

## 2024-04-12 DIAGNOSIS — W19XXXA Unspecified fall, initial encounter: Secondary | ICD-10-CM

## 2024-04-12 DIAGNOSIS — M542 Cervicalgia: Secondary | ICD-10-CM | POA: Diagnosis not present

## 2024-04-12 DIAGNOSIS — R519 Headache, unspecified: Secondary | ICD-10-CM | POA: Insufficient documentation

## 2024-04-12 MED ORDER — CYCLOBENZAPRINE HCL 10 MG PO TABS
10.0000 mg | ORAL_TABLET | Freq: Once | ORAL | Status: AC
  Administered 2024-04-12: 10 mg via ORAL
  Filled 2024-04-12: qty 1

## 2024-04-12 NOTE — Discharge Instructions (Addendum)
 You were evaluated in the emergency department today for evaluation after your fall  Based on your evaluation, there is no evidence of a serious or life-threatening condition at this time.  However, we recommend that you continue closely monitoring your symptoms and arrange close follow-up with your primary care provider for reevaluation.  Your symptoms may improve on their own, but we recommend follow-up or return to the emergency department if they continue/worsen.  Follow-up: - Follow-up with your primary care provider or a specialist if your symptoms persist, worsen, or you have additional concerns.   - We typically recommend follow-up with your PCP within the next 1-3 days, unless directed otherwise by your medical provider. - If a follow-up appointment has already been scheduled, we recommend you continue to keep that appointment to discuss your recent ED visit  Return to the emergency department if you experience: - Worsening or new symptoms - New fever or fever that persists - Difficulty breathing, chest pain, severe pain, or weakness. - Signs of infection at any wound site (increased redness, warmth, discharge) - You have any other concerns or unexpected changes  Medications: - New medications prescribed by the ED: N/A - Pain relief: Use over-the-counter pain relievers like ibuprofen  or Tylenol  as directed on the package.  Talk to your medical provider if you have certain medical conditions like kidney disease or liver disease to make sure these medications are safe for you to use. - Continue to take all your medications as directed.  Contact your primary care physician (or seek medical care from a medical provider) if you have any side effects or questions  Other instructions: - Drink plenty of fluids to stay hydrated (we recommend water or fluids that contain electrolytes including Gatorade/Powerade/Pedialyte) - Continue to rest and care for yourself at home - Avoid strenuous  activity if feeling unwell - If applicable: consider applying ice packs or a heating pad to the affected area of injury for 20 minutes at a time, 4-5 times a day, for the next 24-48 hours.   Once again, we highly encourage you to contact your primary care provider or return to the emergency department if you have any further concerns or questions.

## 2024-04-12 NOTE — ED Triage Notes (Signed)
 Pt reports falling out of bed at 5 am. Endorses pain to head, left side of neck, shoulder and back. Pt landed on ground.

## 2024-04-12 NOTE — ED Triage Notes (Signed)
 Patient arrives POV c/o back, shoulder, and neck pain after falling out of bed last night. Patient reports that she has a very tall sleigh bed and hx of back surgery with titanium cage. Ambulatory with obvious pain to registration.

## 2024-04-12 NOTE — ED Provider Notes (Signed)
 [place] EMERGENCY DEPARTMENT AT [facility] Provider Note   CSN: [id number] Arrival date & time: 04/12/24  1306     Patient presents with: Fall and Shoulder Pain   [name] is a 43 y.o. female.    Fall  Shoulder Pain  Accompanied by son.  Past medical history of depression, COPD on 3 L at home, chronic back pain presents after falling from her bed this morning.  She does not know why she fell.  She has otherwise been in her normal state of health, she does not member a bad dream, or feeling weird when she went to bed the night before, she just woke up as she hit the ground around 5 AM this morning.  She says her bed is really high up about 2 to 3 feet.  Denies any changes in medication or substance use the night prior.  Since a fall she has felt like she has a loss of balance, not like the world is spinning but she feels unsteady on her feet.  She cannot remember if she hit her head but she does have a right sided frontal headache.  She wears her glasses to sleep and noticed that they are crooked since the fall.  She does not feel like she is talking slower or slurring her words. Denies changes in vision or dysphagia.   Since the fall she has had pain in her left shoulder and feels like she can feel her heart beating in her left hand.  She feels like this is contributing to her balance issues because she is unable to move her arm when she walks and is having to carry it.  She has not noticed any bruising or swelling.  She does have a bruise on her left anterior shin but says that has been there when she hit it on a step and it is not from the fall.  He has chronic low back pain and neuropathy in both legs and worse in the left leg.  The only pain she has since the fall that is new is her right shoulder pain and she does have some sob maxillary tenderness worse with pressure.     Prior to Admission medications   Medication Sig Start Date End Date Taking?  Authorizing Provider  albuterol  (PROVENTIL ) (2.5 MG/3ML) 0.083% nebulizer solution Take 2.5 mg by nebulization 3 (three) times daily as needed. Shortness of breath and wheezing     [provider]  ALPRAZolam  (XANAX ) 1 MG tablet Take 1 mg by mouth in the morning, at noon, and at bedtime.    [provider]  budesonide-formoterol (SYMBICORT) 160-4.5 MCG/ACT inhaler Inhale 2 puffs into the lungs 2 (two) times daily as needed (for shortness of breath).    [provider]  HYDROmorphone  (DILAUDID ) 4 MG tablet Take 4 mg by mouth 3 (three) times daily as needed for moderate pain or severe pain.     [provider]  mirtazapine  (REMERON ) 45 MG tablet Take 45 mg by mouth at bedtime.    [provider]  predniSONE  (DELTASONE ) 10 MG tablet Take 4 tablets (40 mg) daily for 2 days, then, Take 3 tablets (30 mg) daily for 2 days, then, Take 2 tablets (20 mg) daily for 2 days, then, Take 1 tablets (10 mg) daily for 1 days, then stop. 01/30/20   Arlice Reichert, MD  sertraline  (ZOLOFT ) 100 MG tablet Take 100 mg by mouth daily.      [provider]  Allergies: Bactrim, Clindamycin/lincomycin, Darvocet [propoxyphene n-acetaminophen ], and Toradol  [ketorolac  tromethamine ]    Review of Systems  Updated Vital Signs BP (!) 136/91   Pulse (!) 117   Temp 98.7 F (37.1 C) (Oral)   Resp 20   Ht 5' 3 (1.6 m)   Wt 87.5 kg   SpO2 93%   BMI 34.19 kg/m   Physical Exam Vitals reviewed.  Constitutional:      General: She is not in acute distress.    Appearance: Normal appearance. She is obese. She is not toxic-appearing.  HENT:     Head: Normocephalic and atraumatic.     Nose: Nose normal.     Mouth/Throat:     Mouth: Mucous membranes are dry.  Eyes:     Conjunctiva/sclera: Conjunctivae normal.     Comments: Glasses are crooked on her head  Cardiovascular:     Rate and Rhythm: Normal rate and regular rhythm.     Pulses: Normal pulses.     Comments:  Distant heart sounds, regular rate and rhythm Pulmonary:     Effort: Pulmonary effort is normal. No respiratory distress.     Breath sounds: Wheezing present.     Comments: Diffuse wheezing bilaterally Abdominal:     General: Bowel sounds are normal. There is distension.     Tenderness: There is no abdominal tenderness.     Comments: Protuberant abdomen  Musculoskeletal:        General: Signs of injury present. No swelling or deformity.     Cervical back: Normal range of motion.     Right lower leg: No edema.     Left lower leg: No edema.     Comments: Limited range of motion of left shoulder, limited in flexion at the shoulder compared to the right shoulder flexion.  Range of motion symmetric at elbow and wrist. Decreased grip strength due to pain of left side compared to right.  Tenderness to palpation Sub left breast that extends to the axillary line  Skin:    General: Skin is warm.     Capillary Refill: Capillary refill takes less than 2 seconds.     Findings: No bruising, erythema or lesion.  Neurological:     General: No focal deficit present.     Mental Status: She is oriented to person, place, and time.     Sensory: No sensory deficit.     (all labs ordered are listed, but only abnormal results are displayed) Labs Reviewed - No data to display  EKG: None  Radiology: No results found.   Procedures   Medications Ordered in the ED - No data to display                                  Medical Decision Making Amount and/or Complexity of Data Reviewed Radiology: ordered.  Risk Prescription drug management.   This patient presents to the ED for concern of fall from her bed this morning while sleeping, this involves an extensive number of treatment options, and is a complaint that carries with it a high risk of complications and morbidity.  The differential diagnosis includes left shoulder fracture, left humerus fracture, left-sided rib fracture, MSK related  contusion or strain, headache induced from fall, less likely concussion, pneumothorax, or ACS   Co morbidities / Chronic conditions that complicate the patient evaluation  COPD on 3 L at home and currently smokes, depression, chronic back and leg  pain   Additional history obtained:  Patient appears to be on buprenorphine at home.   Lab Tests:  No labs ordered  Imaging Studies ordered:  I ordered imaging studies including x-ray left shoulder, CT head, CT thoracic spine, CT cervical spine I independently visualized and interpreted imaging which showed x-ray left shoulder without acute abnormality, CT cervical spine without acute findings, CT thoracic spine without acute findings Pending CT head results I agree with the radiologist interpretation   Cardiac Monitoring: / EKG:  The patient was not obtained on cardiac monitor   Problem List / ED Course / Critical interventions / Medication management  Right shoulder pain after a fall from her bed I was going to order pain medication but patient is comfortably asleep in the hallway waiting results of her CT.  Will hold off on pain medication at this time since she reported taking her home Xanax  and gabapentin this morning. Patient awake and gave Flexeril .  Imaging is not concerning for an acute fracture, more likely a muscle contusion. Tachycardic and hypertensive saturating at 93% on admission.  2-1/2 hours later her pulse is now 89, 113/87 saturating at 98% on room air. Pending results of CT head, however discussed discharging patient home with a muscle relaxant and recommend heating pad with rest. I have reviewed the patients home medicines and have made adjustments as needed   Consultations Obtained:  No consultations requested   Social Determinants of Health:  Son is present, husband at home   Test / Admission - Considered:  Given unremarkable imaging and stabilization of vitals, admission not necessary although  still pending results of CT.       Final diagnoses:  None    ED Discharge Orders     None          Charmayne Holmes, DO 04/12/24 1559    Lowther, Amy, DO 04/14/24 1921
